# Patient Record
Sex: Female | Born: 1935 | Race: Black or African American | Hispanic: No | Smoking: Former smoker
Health system: Southern US, Community
[De-identification: ages and names within clinical notes are randomized; demographics above are authoritative.]

## PROBLEM LIST (undated history)

## (undated) DIAGNOSIS — E669 Obesity, unspecified: Secondary | ICD-10-CM

## (undated) DIAGNOSIS — E785 Hyperlipidemia, unspecified: Secondary | ICD-10-CM

## (undated) DIAGNOSIS — N189 Chronic kidney disease, unspecified: Secondary | ICD-10-CM

## (undated) DIAGNOSIS — M1990 Unspecified osteoarthritis, unspecified site: Secondary | ICD-10-CM

## (undated) DIAGNOSIS — Z9289 Personal history of other medical treatment: Secondary | ICD-10-CM

## (undated) DIAGNOSIS — I10 Essential (primary) hypertension: Secondary | ICD-10-CM

## (undated) DIAGNOSIS — I2510 Atherosclerotic heart disease of native coronary artery without angina pectoris: Secondary | ICD-10-CM

## (undated) DIAGNOSIS — I219 Acute myocardial infarction, unspecified: Secondary | ICD-10-CM

## (undated) DIAGNOSIS — E538 Deficiency of other specified B group vitamins: Secondary | ICD-10-CM

## (undated) DIAGNOSIS — I509 Heart failure, unspecified: Secondary | ICD-10-CM

## (undated) HISTORY — DX: Unspecified osteoarthritis, unspecified site: M19.90

## (undated) HISTORY — PX: BACK SURGERY: SHX140

## (undated) HISTORY — DX: Hyperlipidemia, unspecified: E78.5

## (undated) HISTORY — DX: Acute myocardial infarction, unspecified: I21.9

## (undated) HISTORY — DX: Personal history of other medical treatment: Z92.89

## (undated) HISTORY — PX: CATARACT EXTRACTION: SUR2

## (undated) HISTORY — DX: Deficiency of other specified B group vitamins: E53.8

## (undated) HISTORY — PX: APPENDECTOMY: SHX54

## (undated) HISTORY — PX: OTHER SURGICAL HISTORY: SHX169

## (undated) HISTORY — DX: Heart failure, unspecified: I50.9

## (undated) HISTORY — DX: Essential (primary) hypertension: I10

## (undated) HISTORY — DX: Chronic kidney disease, unspecified: N18.9

## (undated) HISTORY — DX: Obesity, unspecified: E66.9

---

## 1970-02-23 HISTORY — PX: ABDOMINAL HYSTERECTOMY: SHX81

## 2003-12-03 ENCOUNTER — Ambulatory Visit (HOSPITAL_COMMUNITY): Admission: RE | Admit: 2003-12-03 | Discharge: 2003-12-03 | Payer: Self-pay | Admitting: Internal Medicine

## 2006-07-27 ENCOUNTER — Encounter: Admission: RE | Admit: 2006-07-27 | Discharge: 2006-07-27 | Payer: Self-pay | Admitting: Internal Medicine

## 2009-05-14 ENCOUNTER — Encounter: Admission: RE | Admit: 2009-05-14 | Discharge: 2009-05-14 | Payer: Self-pay | Admitting: Family Medicine

## 2011-01-05 ENCOUNTER — Encounter: Payer: Self-pay | Admitting: Cardiology

## 2011-01-05 ENCOUNTER — Encounter (HOSPITAL_COMMUNITY): Payer: Self-pay | Admitting: Pharmacy Technician

## 2011-01-05 ENCOUNTER — Encounter: Payer: Self-pay | Admitting: *Deleted

## 2011-01-05 ENCOUNTER — Ambulatory Visit (INDEPENDENT_AMBULATORY_CARE_PROVIDER_SITE_OTHER): Payer: Medicare Other | Admitting: Cardiology

## 2011-01-05 DIAGNOSIS — E119 Type 2 diabetes mellitus without complications: Secondary | ICD-10-CM

## 2011-01-05 DIAGNOSIS — E669 Obesity, unspecified: Secondary | ICD-10-CM

## 2011-01-05 DIAGNOSIS — E1129 Type 2 diabetes mellitus with other diabetic kidney complication: Secondary | ICD-10-CM | POA: Insufficient documentation

## 2011-01-05 DIAGNOSIS — I10 Essential (primary) hypertension: Secondary | ICD-10-CM

## 2011-01-05 DIAGNOSIS — R0989 Other specified symptoms and signs involving the circulatory and respiratory systems: Secondary | ICD-10-CM | POA: Insufficient documentation

## 2011-01-05 DIAGNOSIS — Z01810 Encounter for preprocedural cardiovascular examination: Secondary | ICD-10-CM

## 2011-01-05 DIAGNOSIS — R079 Chest pain, unspecified: Secondary | ICD-10-CM | POA: Insufficient documentation

## 2011-01-05 MED ORDER — NITROGLYCERIN 0.4 MG SL SUBL: 0.4000 mg | SUBLINGUAL_TABLET | SUBLINGUAL | Status: DC | PRN

## 2011-01-05 MED ORDER — ASPIRIN 81 MG PO TBEC: 81.0000 mg | DELAYED_RELEASE_TABLET | Freq: Every day | ORAL | Status: AC

## 2011-01-05 MED ORDER — ISOSORBIDE MONONITRATE ER 30 MG PO TB24: 30.0000 mg | ORAL_TABLET | Freq: Every day | ORAL | Status: DC

## 2011-01-05 NOTE — Assessment & Plan Note (Signed)
Her blood pressure is elevated and I will address this further at the time of her catheterization.

## 2011-01-05 NOTE — Assessment & Plan Note (Signed)
Per her primary M.D. Of note she will hold her metformin per protocol.  Of note I will check a lipid profile fasting. She would benefit from a statin.

## 2011-01-05 NOTE — Progress Notes (Signed)
HPI The patient has no prior cardiac history. However, she has significant cardiovascular risk factors. [facility] called today to add her to my schedule as she's been having chest discomfort. She says this started about 2 weeks ago. She describes a squeezing sensation under her sternum and left breast. It is sporadic. It does not last long but it is intense when it occurs. It is not associated with nausea or vomiting though she has gotten diaphoretic. It happens at rest. There is no radiation to her jaw or to her arms. The last episode was yesterday at church. She is not overly active but she has not noticed this happening with her daily activities. She's not describing any new shortness of breath, PND or orthopnea. She has not been noticing any palpitations, presyncope or syncope. She has never had any prior cardiac testing.  Allergies  Allergen Reactions  . Lisinopril   . Penicillins     Current Outpatient Prescriptions  Medication Sig Dispense Refill  . amLODipine (NORVASC) 5 MG tablet Take 5 mg by mouth daily.        . metFORMIN (GLUMETZA) 500 MG (MOD) 24 hr tablet Take 500 mg by mouth 2 (two) times daily with a meal.        . valsartan (DIOVAN) 80 MG tablet Take 80 mg by mouth daily.        Marland Kitchen aspirin 81 MG EC tablet Take 1 tablet (81 mg total) by mouth daily. Swallow whole.  30 tablet  0  . isosorbide mononitrate (IMDUR) 30 MG 24 hr tablet Take 1 tablet (30 mg total) by mouth daily.  30 tablet  11  . nitroGLYCERIN (NITROSTAT) 0.4 MG SL tablet Place 1 tablet (0.4 mg total) under the tongue every 5 (five) minutes as needed for chest pain.  25 tablet  11    Past Medical History  Diagnosis Date  . Hypertension   . Diabetes mellitus   . Obesity     Past Surgical History  Procedure Date  . Appendectomy   . Tumor removed     Abdomen (benign)  . Back surgery     Family History  Problem Relation Age of Onset  . Diabetes Mother     History   Social History  . Marital Status:  Widowed    Spouse Name: N/A    Number of Children: 1  . Years of Education: N/A   Occupational History  . Retired    Social History Main Topics  . Smoking status: Former Smoker    Types: Cigarettes    Quit date: 02/23/1993  . Smokeless tobacco: Not on file  . Alcohol Use: No  . Drug Use: No  . Sexually Active: Not on file   Other Topics Concern  . Not on file   Social History Narrative   Lives alone.  2 grandchildren.  1 great grand.    ROS: As stated in the HPI and negative for all other systems.  PHYSICAL EXAM BP 160/64  Pulse 70  Ht 5\' 2"  (1.575 m)  Wt 199 lb (90.266 kg)  BMI 36.40 kg/m2 GENERAL:  Well appearing HEENT:  Pupils equal round and reactive, fundi not visualized, oral mucosa unremarkable NECK:  No jugular venous distention, waveform within normal limits, carotid upstroke brisk and symmetric, rightcarotid bruit, no thyromegaly LYMPHATICS:  No cervical, inguinal adenopathy LUNGS:  Clear to auscultation bilaterally BACK:  No CVA tenderness CHEST:  Unremarkable HEART:  PMI not displaced or sustained,S1 and S2 within normal limits, no  S3, no S4, no clicks, no rubs, no murmurs ABD:  Flat, positive bowel sounds normal in frequency in pitch, no bruits, no rebound, no guarding, no midline pulsatile mass, no hepatomegaly, no splenomegaly EXT:  2 plus pulses throughout, no edema, no cyanosis no clubbing, left femoral bruit. SKIN:  No rashes no nodules NEURO:  Cranial nerves II through XII grossly intact, motor grossly intact throughout PSYCH:  Cognitively intact, oriented to person place and time   EKG:  Sinus rhythm, rate 66, axis within normal limits, intervals within normal limits, no acute ST-T wave changes. 01/05/2011   ASSESSMENT AND PLAN

## 2011-01-05 NOTE — Assessment & Plan Note (Signed)
She will need carotid doppler.

## 2011-01-05 NOTE — Assessment & Plan Note (Signed)
I am concerned that her chest discomfort is unstable angina. She has significant cardiovascular risk factors and evidence of vascular disease on her exam. I think the pretest probability of obstructive coronary disease is very high. Therefore, cardiac catheterization is indicated. I discussed the risks benefits of this with her at length. She agrees to proceed. I will start her on a low dose of Imdur. She is taking an aspirin already area she will continue her other medications holding her metformin. She will be given supplementary glycerin as well.

## 2011-01-05 NOTE — Assessment & Plan Note (Signed)
The patient understands the need to lose weight with diet and exercise. We have discussed specific strategies for this.  

## 2011-01-05 NOTE — Patient Instructions (Signed)
Please start Isosorbide 30 mg a day Continue all other medications as listed You may take SL NTg for chest pain if needed. To use please sit or lay down first, place 1 tablet under your tongue wait 5 minutes if the chest pain continues you may use a 2nd one - wait five minutes.  If is still remain you may use a 3rd one however you then need to call 911 for transport to the hospital.  Do Not Drive Yourself.  This medication may cause headache or a slight tingling or burning sensation under your tongue.  It is a normal occurrence.  Please have blood work today.  Your physician has requested that you have a cardiac catheterization. Cardiac catheterization is used to diagnose and/or treat various heart conditions. Doctors may recommend this procedure for a number of different reasons. The most common reason is to evaluate chest pain. Chest pain can be a symptom of coronary artery disease (CAD), and cardiac catheterization can show whether plaque is narrowing or blocking your heart's arteries. This procedure is also used to evaluate the valves, as well as measure the blood flow and oxygen levels in different parts of your heart. For further information please visit [email or web address] Please follow instruction sheet, as given.

## 2011-01-06 ENCOUNTER — Encounter (HOSPITAL_COMMUNITY): Payer: Self-pay | Admitting: Cardiology

## 2011-01-06 ENCOUNTER — Encounter (HOSPITAL_COMMUNITY)
Admission: RE | Disposition: A | Discharge status: Home / Self Care | Payer: Self-pay | Source: Ambulatory Visit | Attending: Cardiology

## 2011-01-06 ENCOUNTER — Ambulatory Visit (HOSPITAL_COMMUNITY)
Admission: RE | Admit: 2011-01-06 | Discharge: 2011-01-06 | Disposition: A | Discharge status: Home / Self Care | Payer: Medicare Other | Source: Ambulatory Visit | Attending: Cardiology | Admitting: Cardiology

## 2011-01-06 ENCOUNTER — Encounter (HOSPITAL_COMMUNITY): Payer: Self-pay

## 2011-01-06 DIAGNOSIS — I2510 Atherosclerotic heart disease of native coronary artery without angina pectoris: Secondary | ICD-10-CM | POA: Insufficient documentation

## 2011-01-06 DIAGNOSIS — Z01810 Encounter for preprocedural cardiovascular examination: Secondary | ICD-10-CM

## 2011-01-06 HISTORY — PX: LEFT HEART CATHETERIZATION WITH CORONARY ANGIOGRAM: SHX5451

## 2011-01-06 LAB — CBC WITH DIFFERENTIAL/PLATELET
Basophils Relative: 0.2 % (ref 0.0–3.0)
Eosinophils Relative: 1.4 % (ref 0.0–5.0)
HCT: 37.3 % (ref 36.0–46.0)
MCV: 89.8 fl (ref 78.0–100.0)
Neutro Abs: 5.6 10*3/uL (ref 1.4–7.7)
Neutrophils Relative %: 51.6 % (ref 43.0–77.0)
Platelets: 272 10*3/uL (ref 150.0–400.0)
RBC: 4.15 Mil/uL (ref 3.87–5.11)
WBC: 10.8 10*3/uL — ABNORMAL HIGH (ref 4.5–10.5)

## 2011-01-06 LAB — BASIC METABOLIC PANEL
BUN: 26 mg/dL — ABNORMAL HIGH (ref 6–23)
GFR: 49.69 mL/min — ABNORMAL LOW (ref >60.00)
Potassium: 5 mEq/L (ref 3.5–5.1)
Sodium: 140 mEq/L (ref 135–145)

## 2011-01-06 LAB — GLUCOSE, CAPILLARY: Glucose-Capillary: 150 mg/dL — ABNORMAL HIGH (ref 70–99)

## 2011-01-06 LAB — APTT: aPTT: 31.2 s — ABNORMAL HIGH (ref 21.7–28.8)

## 2011-01-06 SURGERY — LEFT HEART CATHETERIZATION WITH CORONARY ANGIOGRAM
Anesthesia: LOCAL

## 2011-01-06 MED ORDER — OXYCODONE-ACETAMINOPHEN 5-325 MG PO TABS: 1.0000 | ORAL_TABLET | ORAL | Status: DC | PRN

## 2011-01-06 MED ORDER — ASPIRIN 81 MG PO CHEW
324.0000 mg | CHEWABLE_TABLET | ORAL | Status: AC
  Administered 2011-01-06: 324 mg via ORAL
  Filled 2011-01-06: qty 4

## 2011-01-06 MED ORDER — LIDOCAINE HCL (PF) 1 % IJ SOLN
INTRAMUSCULAR | Status: AC
  Filled 2011-01-06: qty 30

## 2011-01-06 MED ORDER — HEPARIN (PORCINE) IN NACL 2-0.9 UNIT/ML-% IJ SOLN
INTRAMUSCULAR | Status: AC
  Filled 2011-01-06: qty 2000

## 2011-01-06 MED ORDER — ATROPINE SULFATE 1 MG/ML IJ SOLN
INTRAMUSCULAR | Status: AC
  Filled 2011-01-06: qty 1

## 2011-01-06 MED ORDER — METFORMIN HCL 500 MG PO TABS: 500.0000 mg | ORAL_TABLET | Freq: Two times a day (BID) | ORAL | Status: DC

## 2011-01-06 MED ORDER — VERAPAMIL HCL 2.5 MG/ML IV SOLN
INTRAVENOUS | Status: AC
  Filled 2011-01-06: qty 2

## 2011-01-06 MED ORDER — ONDANSETRON HCL 4 MG/2ML IJ SOLN: 4.0000 mg | Freq: Four times a day (QID) | INTRAMUSCULAR | Status: DC | PRN

## 2011-01-06 MED ORDER — FENTANYL CITRATE 0.05 MG/ML IJ SOLN
INTRAMUSCULAR | Status: AC
  Filled 2011-01-06: qty 2

## 2011-01-06 MED ORDER — NITROGLYCERIN 0.2 MG/ML ON CALL CATH LAB
INTRAVENOUS | Status: AC
  Filled 2011-01-06: qty 1

## 2011-01-06 MED ORDER — ACETAMINOPHEN 325 MG PO TABS: 650.0000 mg | ORAL_TABLET | ORAL | Status: DC | PRN

## 2011-01-06 MED ORDER — MIDAZOLAM HCL 2 MG/2ML IJ SOLN
INTRAMUSCULAR | Status: AC
  Filled 2011-01-06: qty 2

## 2011-01-06 MED ORDER — SODIUM CHLORIDE 0.9 % IV SOLN
250.0000 mL | INTRAVENOUS | Status: DC
  Administered 2011-01-06: 1 mL/kg/h via INTRAVENOUS

## 2011-01-06 MED ORDER — SODIUM CHLORIDE 0.9 % IJ SOLN: 3.0000 mL | Freq: Two times a day (BID) | INTRAMUSCULAR | Status: DC

## 2011-01-06 MED ORDER — HEPARIN SODIUM (PORCINE) 1000 UNIT/ML IJ SOLN
INTRAMUSCULAR | Status: AC
  Filled 2011-01-06: qty 1

## 2011-01-06 MED ORDER — ONDANSETRON HCL 4 MG/2ML IJ SOLN
INTRAMUSCULAR | Status: AC
  Filled 2011-01-06: qty 2

## 2011-01-06 MED ORDER — SODIUM CHLORIDE 0.9 % IV SOLN: 1.0000 mL/kg/h | INTRAVENOUS | Status: DC

## 2011-01-06 NOTE — H&P (View-Only) (Signed)
HPI The patient has no prior cardiac history. However, she has significant cardiovascular risk factors. [facility] called today to add her to my schedule as she's been having chest discomfort. She says this started about 2 weeks ago. She describes a squeezing sensation under her sternum and left breast. It is sporadic. It does not last long but it is intense when it occurs. It is not associated with nausea or vomiting though she has gotten diaphoretic. It happens at rest. There is no radiation to her jaw or to her arms. The last episode was yesterday at church. She is not overly active but she has not noticed this happening with her daily activities. She's not describing any new shortness of breath, PND or orthopnea. She has not been noticing any palpitations, presyncope or syncope. She has never had any prior cardiac testing.  Allergies  Allergen Reactions  . Lisinopril   . Penicillins     Current Outpatient Prescriptions  Medication Sig Dispense Refill  . amLODipine (NORVASC) 5 MG tablet Take 5 mg by mouth daily.        . metFORMIN (GLUMETZA) 500 MG (MOD) 24 hr tablet Take 500 mg by mouth 2 (two) times daily with a meal.        . valsartan (DIOVAN) 80 MG tablet Take 80 mg by mouth daily.        . aspirin 81 MG EC tablet Take 1 tablet (81 mg total) by mouth daily. Swallow whole.  30 tablet  0  . isosorbide mononitrate (IMDUR) 30 MG 24 hr tablet Take 1 tablet (30 mg total) by mouth daily.  30 tablet  11  . nitroGLYCERIN (NITROSTAT) 0.4 MG SL tablet Place 1 tablet (0.4 mg total) under the tongue every 5 (five) minutes as needed for chest pain.  25 tablet  11    Past Medical History  Diagnosis Date  . Hypertension   . Diabetes mellitus   . Obesity     Past Surgical History  Procedure Date  . Appendectomy   . Tumor removed     Abdomen (benign)  . Back surgery     Family History  Problem Relation Age of Onset  . Diabetes Mother     History   Social History  . Marital Status:  Widowed    Spouse Name: N/A    Number of Children: 1  . Years of Education: N/A   Occupational History  . Retired    Social History Main Topics  . Smoking status: Former Smoker    Types: Cigarettes    Quit date: 02/23/1993  . Smokeless tobacco: Not on file  . Alcohol Use: No  . Drug Use: No  . Sexually Active: Not on file   Other Topics Concern  . Not on file   Social History Narrative   Lives alone.  2 grandchildren.  1 great grand.    ROS: As stated in the HPI and negative for all other systems.  PHYSICAL EXAM BP 160/64  Pulse 70  Ht 5' 2" (1.575 m)  Wt 199 lb (90.266 kg)  BMI 36.40 kg/m2 GENERAL:  Well appearing HEENT:  Pupils equal round and reactive, fundi not visualized, oral mucosa unremarkable NECK:  No jugular venous distention, waveform within normal limits, carotid upstroke brisk and symmetric, rightcarotid bruit, no thyromegaly LYMPHATICS:  No cervical, inguinal adenopathy LUNGS:  Clear to auscultation bilaterally BACK:  No CVA tenderness CHEST:  Unremarkable HEART:  PMI not displaced or sustained,S1 and S2 within normal limits, no   S3, no S4, no clicks, no rubs, no murmurs ABD:  Flat, positive bowel sounds normal in frequency in pitch, no bruits, no rebound, no guarding, no midline pulsatile mass, no hepatomegaly, no splenomegaly EXT:  2 plus pulses throughout, no edema, no cyanosis no clubbing, left femoral bruit. SKIN:  No rashes no nodules NEURO:  Cranial nerves II through XII grossly intact, motor grossly intact throughout PSYCH:  Cognitively intact, oriented to person place and time   EKG:  Sinus rhythm, rate 66, axis within normal limits, intervals within normal limits, no acute ST-T wave changes. 01/05/2011   ASSESSMENT AND PLAN    

## 2011-01-06 NOTE — Interval H&P Note (Signed)
History and Physical Interval Note:   01/06/2011   1:53 PM   Alexandra Gross  has presented today for surgery, with the diagnosis of Chest pain  The various methods of treatment have been discussed with the patient and family. After consideration of risks, benefits and other options for treatment, the patient has consented to  Procedure(s): LEFT HEART CATHETERIZATION WITH CORONARY ANGIOGRAM as a surgical intervention .  The patients' history has been reviewed, patient examined, no change in status, stable for surgery.  I have reviewed the patients' chart and labs.  Questions were answered to the patient's satisfaction.     Rollene Rotunda  MD

## 2011-01-06 NOTE — Op Note (Signed)
Cardiac Catheterization Procedure Note  Name: Alexandra Gross MRN: [id number] DOB: 07/05/1928  Procedure: Left Heart Cath, Selective Coronary Angiography, LV angiography  Indication: Chest Pain   Procedural details: The right radial was prepped, draped, and anesthetized with 1% lidocaine. Using modified Seldinger technique, a 5 French sheath was introduced into the right radial artery. Standard Judkins catheters were used for coronary angiography and left ventriculography. Catheter exchanges were performed over a guidewire. There were no immediate procedural complications. The patient was transferred to the post catheterization recovery area for further monitoring.  Procedural Findings:  Hemodynamics:      AO 135/50    LV 132/18    Coronary angiography:  Coronary dominance: Right  Left mainstem:   Normal  Left anterior descending (LAD):   Tortuous.  Large vessel wraps the apex.  Mild luminal irregularities.  D1 small and normal.  D2 moderate and normal  Left circumflex (LCx):  AV groove ostial 25%.  Diffuse luminal irregularities.  RI moderate sized and branching.  Diffuse luminal irregularities.  Long 50% stenosis before a small PL.  Right coronary artery (RCA):  Moderate calcification.  Prox 50% focal lesion.  Long distal 50% tandem lesions before the PDA.  PDA moderate with luminal irregularities.  PL moderate and normal.  Left ventriculography: Left ventricular systolic function is normal, LVEF is estimated at 55-65%, there is no significant mitral regurgitation   Final Conclusions:   Nonobstructive CAD with preserved EF  Recommendations: Medical management with risk reduction.     Rollene Rotunda 01/06/2011, 2:35 PM

## 2011-02-10 ENCOUNTER — Encounter: Payer: Self-pay | Admitting: Internal Medicine

## 2011-02-10 DIAGNOSIS — Z0000 Encounter for general adult medical examination without abnormal findings: Secondary | ICD-10-CM | POA: Insufficient documentation

## 2011-02-16 ENCOUNTER — Telehealth: Payer: Self-pay

## 2011-02-16 ENCOUNTER — Other Ambulatory Visit (INDEPENDENT_AMBULATORY_CARE_PROVIDER_SITE_OTHER): Payer: Medicare Other

## 2011-02-16 ENCOUNTER — Encounter: Payer: Self-pay | Admitting: Internal Medicine

## 2011-02-16 ENCOUNTER — Ambulatory Visit (INDEPENDENT_AMBULATORY_CARE_PROVIDER_SITE_OTHER): Payer: Medicare Other | Admitting: Internal Medicine

## 2011-02-16 VITALS — BP 144/60 | HR 73 | Temp 99.2°F | Ht 62.0 in | Wt 199.2 lb

## 2011-02-16 DIAGNOSIS — E119 Type 2 diabetes mellitus without complications: Secondary | ICD-10-CM

## 2011-02-16 DIAGNOSIS — M25569 Pain in unspecified knee: Secondary | ICD-10-CM

## 2011-02-16 DIAGNOSIS — M25561 Pain in right knee: Secondary | ICD-10-CM | POA: Insufficient documentation

## 2011-02-16 DIAGNOSIS — Z0000 Encounter for general adult medical examination without abnormal findings: Secondary | ICD-10-CM

## 2011-02-16 DIAGNOSIS — I2510 Atherosclerotic heart disease of native coronary artery without angina pectoris: Secondary | ICD-10-CM | POA: Insufficient documentation

## 2011-02-16 DIAGNOSIS — I10 Essential (primary) hypertension: Secondary | ICD-10-CM

## 2011-02-16 DIAGNOSIS — E538 Deficiency of other specified B group vitamins: Secondary | ICD-10-CM

## 2011-02-16 DIAGNOSIS — M25562 Pain in left knee: Secondary | ICD-10-CM

## 2011-02-16 LAB — URINALYSIS, ROUTINE W REFLEX MICROSCOPIC
Bilirubin Urine: NEGATIVE
Leukocytes, UA: NEGATIVE
Nitrite: NEGATIVE
Specific Gravity, Urine: 1.02 (ref 1.000–1.030)
Total Protein, Urine: NEGATIVE
pH: 6 (ref 5.0–8.0)

## 2011-02-16 LAB — IBC PANEL
Saturation Ratios: 24.1 % (ref 20.0–50.0)
Transferrin: 275.4 mg/dL (ref 212.0–360.0)

## 2011-02-16 LAB — LIPID PANEL
LDL Cholesterol: 131 mg/dL — ABNORMAL HIGH (ref 0–99)
Total CHOL/HDL Ratio: 4
VLDL: 20.2 mg/dL (ref 0.0–40.0)

## 2011-02-16 LAB — TSH: TSH: 1.83 u[IU]/mL (ref 0.35–5.50)

## 2011-02-16 LAB — BASIC METABOLIC PANEL
GFR: 48.04 mL/min — ABNORMAL LOW (ref >60.00)
Potassium: 5 mEq/L (ref 3.5–5.1)
Sodium: 140 mEq/L (ref 135–145)

## 2011-02-16 LAB — HEPATIC FUNCTION PANEL
AST: 19 U/L (ref 0–37)
Alkaline Phosphatase: 60 U/L (ref 39–117)
Bilirubin, Direct: 0.1 mg/dL (ref 0.0–0.3)
Total Bilirubin: 0.7 mg/dL (ref 0.3–1.2)

## 2011-02-16 LAB — MICROALBUMIN / CREATININE URINE RATIO: Microalb, Ur: 1.7 mg/dL (ref 0.0–1.9)

## 2011-02-16 LAB — CBC WITH DIFFERENTIAL/PLATELET
Basophils Relative: 0.5 % (ref 0.0–3.0)
Eosinophils Relative: 2.2 % (ref 0.0–5.0)
Lymphocytes Relative: 41.1 % (ref 12.0–46.0)
Monocytes Absolute: 0.6 10*3/uL (ref 0.1–1.0)
Monocytes Relative: 7 % (ref 3.0–12.0)
Neutrophils Relative %: 49.2 % (ref 43.0–77.0)
Platelets: 273 10*3/uL (ref 150.0–400.0)
RBC: 3.97 Mil/uL (ref 3.87–5.11)
WBC: 9 10*3/uL (ref 4.5–10.5)

## 2011-02-16 MED ORDER — ATORVASTATIN CALCIUM 10 MG PO TABS: 10.0000 mg | ORAL_TABLET | Freq: Every day | ORAL | Status: DC

## 2011-02-16 MED ORDER — METFORMIN HCL ER (MOD) 500 MG PO TB24: 500.0000 mg | ORAL_TABLET | Freq: Two times a day (BID) | ORAL | Status: DC

## 2011-02-16 NOTE — Assessment & Plan Note (Signed)
C/w bilat knee djd, for handicap form filled out today

## 2011-02-16 NOTE — Assessment & Plan Note (Signed)
Overall doing well, age appropriate education and counseling updated, referrals for preventative services and immunizations addressed, dietary and smoking counseling addressed, most recent labs and ECG reviewed.  I have personally reviewed and have noted: 1) the patient's medical and social history 2) The pt's use of alcohol, tobacco, and illicit drugs 3) The patient's current medications and supplements 4) Functional ability including ADL's, fall risk, home safety risk, hearing and visual impairment 5) Diet and physical activities 6) Evidence for depression or mood disorder 7) The patient's height, weight, and BMI have been recorded in the chart I have made referrals, and provided counseling and education based on review of the above Decliens immunizations  ro colonoscopy

## 2011-02-16 NOTE — Telephone Encounter (Signed)
Ok for change.

## 2011-02-16 NOTE — Telephone Encounter (Signed)
[facility] does not stock Glumetza, may they use generic Glucophage XR instead?

## 2011-02-16 NOTE — Telephone Encounter (Signed)
Pharmacy informed.

## 2011-02-16 NOTE — Patient Instructions (Signed)
Continue all other medications as before Please go to LAB in the Basement for the blood and/or urine tests to be done today Please call the phone number [phone] (the [employer] System) for results of testing in 2-3 days;  When calling, simply dial the number, and when prompted enter the MRN number above (the Medical Record Number) and the # key, then the message should start. Please return in 6 mo with Lab testing done 3-5 days before  

## 2011-02-17 ENCOUNTER — Encounter: Payer: Self-pay | Admitting: Internal Medicine

## 2011-02-17 NOTE — Assessment & Plan Note (Signed)
Mild elev today, controlled per pt at home, to cont to monitor at home and next visit  BP Readings from Last 3 Encounters:  02/16/11 144/60  01/06/11 133/48  01/06/11 133/48

## 2011-02-17 NOTE — Assessment & Plan Note (Signed)
stable overall by hx and exam, most recent data reviewed with pt, and pt to continue medical treatment as before Lab Results  Component Value Date   HGBA1C 6.9* 02/16/2011

## 2011-02-17 NOTE — Progress Notes (Signed)
Subjective:    Patient ID: Alexandra Gross, female    DOB: 07/05/1928, 77 y.o.   MRN: [id number]  HPI  Here for wellness and f/u;  Overall doing ok;  Pt denies CP, worsening SOB, DOE, wheezing, orthopnea, PND, worsening LE edema, palpitations, dizziness or syncope.  Pt denies neurological change such as new Headache, facial or extremity weakness.  Pt denies polydipsia, polyuria, or low sugar symptoms. Pt states overall good compliance with treatment and medications, good tolerability, and trying to follow lower cholesterol diet.  Pt denies worsening depressive symptoms, suicidal ideation or panic. No fever, wt loss, night sweats, loss of appetite, or other constitutional symptoms.  Pt states good ability with ADL's, low fall risk, home safety reviewed and adequate, no significant changes in hearing or vision, and occasionally active with exercise.  Incidentally, pt son died in MVA 1 mo ago, with whom she was very close, and he called her every day. Denies need for further eval or tx, including counseling or SSRI trial.  Has strong faith and social support.  Does have signficant bilat knee pain with ambulation, does not want meds, but asks for handicap permit to park in front of stores.  BP at  Home has been < 140/90 Past Medical History  Diagnosis Date  . Hypertension   . Diabetes mellitus   . Obesity   . Arthritis    Past Surgical History  Procedure Date  . Appendectomy   . Tumor removed     Abdomen (benign)  . Back surgery   . Abdominal hysterectomy 1972    reports that she quit smoking about 17 years ago. Her smoking use included Cigarettes. She does not have any smokeless tobacco history on file. She reports that she does not drink alcohol or use illicit drugs. family history includes Arthritis in her mother; Diabetes in her mother; and Hypertension in her mother. Allergies  Allergen Reactions  . Lisinopril Other (See Comments)    unknown  . Penicillins Other (See Comments)    unknown    Current Outpatient Prescriptions on File Prior to Visit  Medication Sig Dispense Refill  . amLODipine (NORVASC) 5 MG tablet Take 5 mg by mouth daily.       Marland Kitchen aspirin 81 MG EC tablet Take 1 tablet (81 mg total) by mouth daily. Swallow whole.  30 tablet  0  . COD LIVER OIL PO Take 1 capsule by mouth daily.        . nitroGLYCERIN (NITROSTAT) 0.4 MG SL tablet Place 1 tablet (0.4 mg total) under the tongue every 5 (five) minutes as needed for chest pain.  25 tablet  11  . valsartan (DIOVAN) 80 MG tablet Take 80 mg by mouth daily.       Marland Kitchen VITAMIN E PO Take 1 capsule by mouth daily.         Review of Systems Review of Systems  Constitutional: Negative for diaphoresis, activity change, appetite change and unexpected weight change.  HENT: Negative for hearing loss, ear pain, facial swelling, mouth sores and neck stiffness.   Eyes: Negative for pain, redness and visual disturbance.  Respiratory: Negative for shortness of breath and wheezing.   Cardiovascular: Negative for chest pain and palpitations.  Gastrointestinal: Negative for diarrhea, blood in stool, abdominal distention and rectal pain.  Genitourinary: Negative for hematuria, flank pain and decreased urine volume.  Musculoskeletal: Negative for myalgias and joint swelling.  Skin: Negative for color change and wound.  Neurological: Negative for syncope and numbness.  Hematological: Negative for adenopathy.  Psychiatric/Behavioral: Negative for hallucinations, self-injury, decreased concentration and agitation.      Objective:   Physical Exam BP 144/60  Pulse 73  Temp(Src) 99.2 F (37.3 C) (Oral)  Ht 5\' 2"  (1.575 m)  Wt 199 lb 4 oz (90.379 kg)  BMI 36.44 kg/m2  SpO2 99% Physical Exam  VS noted Constitutional: Pt is oriented to person, place, and time. Appears well-developed and well-nourished.  HENT:  Head: Normocephalic and atraumatic.  Right Ear: External ear normal.  Left Ear: External ear normal.  Nose: Nose normal.   Mouth/Throat: Oropharynx is clear and moist.  Eyes: Conjunctivae and EOM are normal. Pupils are equal, round, and reactive to light.  Neck: Normal range of motion. Neck supple. No JVD present. No tracheal deviation present.  Cardiovascular: Normal rate, regular rhythm, normal heart sounds and intact distal pulses.   Pulmonary/Chest: Effort normal and breath sounds normal.  Abdominal: Soft. Bowel sounds are normal. There is no tenderness.  Musculoskeletal: Normal range of motion. Exhibits no edema.  Lymphadenopathy:  Has no cervical adenopathy.  Neurological: Pt is alert and oriented to person, place, and time. Pt has normal reflexes. No cranial nerve deficit.  Skin: Skin is warm and dry. No rash noted.  Psychiatric:  Has  normal mood and affect. Behavior is normal. Saddened but not tearful or depressed affect today Bilat knee crepitus and bony DJD changes, no effusion, has FROM    Assessment & Plan:

## 2011-02-18 ENCOUNTER — Other Ambulatory Visit: Payer: Self-pay | Admitting: Internal Medicine

## 2011-02-18 DIAGNOSIS — E538 Deficiency of other specified B group vitamins: Secondary | ICD-10-CM

## 2011-02-18 LAB — B12 AND FOLATE PANEL: Folate: 11.4 ng/mL (ref >5.9)

## 2011-04-06 ENCOUNTER — Other Ambulatory Visit: Payer: Self-pay

## 2011-04-06 MED ORDER — VALSARTAN 80 MG PO TABS: 80.0000 mg | ORAL_TABLET | Freq: Every day | ORAL | Status: DC

## 2011-04-06 MED ORDER — AMLODIPINE BESYLATE 5 MG PO TABS: 5.0000 mg | ORAL_TABLET | Freq: Every day | ORAL | Status: DC

## 2011-08-18 ENCOUNTER — Encounter: Payer: Self-pay | Admitting: Internal Medicine

## 2011-08-18 ENCOUNTER — Other Ambulatory Visit (INDEPENDENT_AMBULATORY_CARE_PROVIDER_SITE_OTHER): Payer: Medicare Other

## 2011-08-18 ENCOUNTER — Other Ambulatory Visit: Payer: Self-pay | Admitting: Internal Medicine

## 2011-08-18 ENCOUNTER — Ambulatory Visit (INDEPENDENT_AMBULATORY_CARE_PROVIDER_SITE_OTHER): Payer: Medicare Other | Admitting: Internal Medicine

## 2011-08-18 VITALS — BP 122/62 | HR 70 | Temp 98.1°F | Ht 62.5 in | Wt 204.2 lb

## 2011-08-18 DIAGNOSIS — I10 Essential (primary) hypertension: Secondary | ICD-10-CM

## 2011-08-18 DIAGNOSIS — E538 Deficiency of other specified B group vitamins: Secondary | ICD-10-CM

## 2011-08-18 DIAGNOSIS — N189 Chronic kidney disease, unspecified: Secondary | ICD-10-CM | POA: Insufficient documentation

## 2011-08-18 DIAGNOSIS — E119 Type 2 diabetes mellitus without complications: Secondary | ICD-10-CM

## 2011-08-18 DIAGNOSIS — Z0000 Encounter for general adult medical examination without abnormal findings: Secondary | ICD-10-CM

## 2011-08-18 DIAGNOSIS — E785 Hyperlipidemia, unspecified: Secondary | ICD-10-CM

## 2011-08-18 LAB — BASIC METABOLIC PANEL
BUN: 20 mg/dL (ref 6–23)
Chloride: 107 mEq/L (ref 96–112)
Creatinine, Ser: 1.5 mg/dL — ABNORMAL HIGH (ref 0.4–1.2)
Glucose, Bld: 113 mg/dL — ABNORMAL HIGH (ref 70–99)

## 2011-08-18 LAB — LIPID PANEL
Cholesterol: 185 mg/dL (ref 0–200)
LDL Cholesterol: 115 mg/dL — ABNORMAL HIGH (ref 0–99)
Triglycerides: 112 mg/dL (ref 0.0–149.0)

## 2011-08-18 LAB — VITAMIN B12: Vitamin B-12: 292 pg/mL (ref 211–911)

## 2011-08-18 LAB — HEMOGLOBIN A1C: Hgb A1c MFr Bld: 7 % — ABNORMAL HIGH (ref 4.6–6.5)

## 2011-08-18 MED ORDER — ATORVASTATIN CALCIUM 20 MG PO TABS: 20.0000 mg | ORAL_TABLET | Freq: Every day | ORAL | Status: DC

## 2011-08-18 NOTE — Patient Instructions (Addendum)
Continue all other medications as before Please have the pharmacy call with any refills you may need. Please continue your efforts at being more active, low cholesterol diet, and weight control. Please go to LAB in the Basement for the blood and/or urine tests to be done today You will be contacted by phone if any changes need to be made immediately.  Otherwise, you will receive a letter about your results with an explanation. Please return in 6 mo with Lab testing done 3-5 days before

## 2011-08-18 NOTE — Progress Notes (Signed)
Subjective:    Patient ID: Alexandra Gross, female    DOB: 07/05/1928, 78 y.o.   MRN: [id number]  HPI  Here to f/u; overall doing ok,  Pt denies chest pain, increased sob or doe, wheezing, orthopnea, PND, increased LE swelling, palpitations, dizziness or syncope.  Pt denies new neurological symptoms such as new headache, or facial or extremity weakness or numbness   Pt denies polydipsia, polyuria, or low sugar symptoms such as weakness or confusion improved with po intake.  Pt states overall good compliance with meds, trying to follow lower cholesterol, diabetic diet, wt overall stable but little exercise however.  Son lived in [place] in [place] late last yr.  Denies worsening depressive symptoms, suicidal ideation, or panic. Has been taking the oral B12 since last visit.  Has some chronic bilat knee pain with ambulation, no swelling or giveaway or falls. Past Medical History  Diagnosis Date  . Hypertension   . Diabetes mellitus   . Obesity   . Arthritis    Past Surgical History  Procedure Date  . Appendectomy   . Tumor removed     Abdomen (benign)  . Back surgery   . Abdominal hysterectomy 1972    reports that she quit smoking about 18 years ago. Her smoking use included Cigarettes. She does not have any smokeless tobacco history on file. She reports that she does not drink alcohol or use illicit drugs. family history includes Arthritis in her mother; Diabetes in her mother; and Hypertension in her mother. Allergies  Allergen Reactions  . Lisinopril Other (See Comments)    unknown  . Penicillins Other (See Comments)    unknown   Current Outpatient Prescriptions on File Prior to Visit  Medication Sig Dispense Refill  . amLODipine (NORVASC) 5 MG tablet Take 1 tablet (5 mg total) by mouth daily.  30 tablet  11  . aspirin 81 MG EC tablet Take 1 tablet (81 mg total) by mouth daily. Swallow whole.  30 tablet  0  . COD LIVER OIL PO Take 1 capsule by mouth daily.        . metFORMIN (GLUMETZA) 500 MG  (MOD) 24 hr tablet Take 1 tablet (500 mg total) by mouth 2 (two) times daily with a meal.  60 tablet  11  . nitroGLYCERIN (NITROSTAT) 0.4 MG SL tablet Place 1 tablet (0.4 mg total) under the tongue every 5 (five) minutes as needed for chest pain.  25 tablet  11  . valsartan (DIOVAN) 80 MG tablet Take 1 tablet (80 mg total) by mouth daily.  30 tablet  11  . VITAMIN E PO Take 1 capsule by mouth daily.        Marland Kitchen atorvastatin (LIPITOR) 10 MG tablet Take 1 tablet (10 mg total) by mouth daily.  90 tablet  3   Review of Systems Review of Systems  Constitutional: Negative for diaphoresis and unexpected weight change.  HENT: Negative for drooling and tinnitus.   Eyes: Negative for photophobia and visual disturbance.  Respiratory: Negative for choking and stridor.   Gastrointestinal: Negative for vomiting and blood in stool.  Skin: Negative for color change and wound.  Neurological: Negative for tremors and numbness.  Psychiatric/Behavioral: Negative for decreased concentration. The patient is not hyperactive.       Objective:   Physical Exam BP 122/62  Pulse 70  Temp 98.1 F (36.7 C) (Oral)  Ht 5' 2.5" (1.588 m)  Wt 204 lb 4 oz (92.647 kg)  BMI 36.76 kg/m2  SpO2 97% Physical Exam  VS noted Constitutional: Pt appears well-developed and well-nourished.  HENT: Head: Normocephalic.  Right Ear: External ear normal.  Left Ear: External ear normal.  Eyes: Conjunctivae and EOM are normal. Pupils are equal, round, and reactive to light.  Neck: Normal range of motion. Neck supple.  Cardiovascular: Normal rate and regular rhythm.   Pulmonary/Chest: Effort normal and breath sounds normal.  Neurological: Pt is alert. Not confused Skin: Skin is warm. No erythema.  Psychiatric: Pt behavior is normal. Thought content normal. Has some benign forgetflness at home    Assessment & Plan:

## 2011-08-18 NOTE — Assessment & Plan Note (Signed)
stable overall by hx and exam, most recent data reviewed with pt, and pt to continue medical treatment as before Lab Results  Component Value Date   HGBA1C 6.9* 02/16/2011   To further work on diet, wt loss

## 2011-08-18 NOTE — Assessment & Plan Note (Signed)
stable overall by hx and exam, most recent data reviewed with pt, and pt to continue medical treatment as before BP Readings from Last 3 Encounters:  08/18/11 122/62  02/16/11 144/60  01/06/11 133/48

## 2011-08-18 NOTE — Assessment & Plan Note (Signed)
stable overall by hx and exam, most recent data reviewed with pt, and pt to continue medical treatment as before Lab Results  Component Value Date   LDLCALC 131* 02/16/2011

## 2011-08-18 NOTE — Assessment & Plan Note (Signed)
stable overall by hx and exam, most recent data reviewed with pt, and pt to continue medical treatment as before, for f/u today, volume ok Lab Results  Component Value Date   CREATININE 1.4* 02/16/2011

## 2011-08-19 ENCOUNTER — Ambulatory Visit: Payer: Medicare Other | Admitting: Internal Medicine

## 2012-02-08 ENCOUNTER — Other Ambulatory Visit (INDEPENDENT_AMBULATORY_CARE_PROVIDER_SITE_OTHER): Payer: Medicare Other

## 2012-02-08 DIAGNOSIS — IMO0001 Reserved for inherently not codable concepts without codable children: Secondary | ICD-10-CM

## 2012-02-08 DIAGNOSIS — Z0000 Encounter for general adult medical examination without abnormal findings: Secondary | ICD-10-CM

## 2012-02-08 LAB — HEPATIC FUNCTION PANEL
AST: 26 U/L (ref 0–37)
Alkaline Phosphatase: 70 U/L (ref 39–117)
Total Bilirubin: 1 mg/dL (ref 0.3–1.2)

## 2012-02-08 LAB — URINALYSIS, ROUTINE W REFLEX MICROSCOPIC
Specific Gravity, Urine: 1.025 (ref 1.000–1.030)
Urine Glucose: NEGATIVE

## 2012-02-08 LAB — CBC WITH DIFFERENTIAL/PLATELET
Basophils Absolute: 0.2 10*3/uL — ABNORMAL HIGH (ref 0.0–0.1)
Eosinophils Absolute: 0.1 10*3/uL (ref 0.0–0.7)
Lymphocytes Relative: 39.4 % (ref 12.0–46.0)
MCHC: 33.3 g/dL (ref 30.0–36.0)
Monocytes Relative: 6.7 % (ref 3.0–12.0)
Platelets: 302 10*3/uL (ref 150.0–400.0)
RDW: 13.2 % (ref 11.5–14.6)

## 2012-02-08 LAB — BASIC METABOLIC PANEL
BUN: 23 mg/dL (ref 6–23)
Calcium: 9.1 mg/dL (ref 8.4–10.5)
Creatinine, Ser: 1.3 mg/dL — ABNORMAL HIGH (ref 0.4–1.2)
GFR: 52.34 mL/min — ABNORMAL LOW (ref >60.00)
Glucose, Bld: 215 mg/dL — ABNORMAL HIGH (ref 70–99)
Sodium: 138 mEq/L (ref 135–145)

## 2012-02-08 LAB — LIPID PANEL
Cholesterol: 125 mg/dL (ref 0–200)
HDL: 43.6 mg/dL (ref >39.00)
Triglycerides: 102 mg/dL (ref 0.0–149.0)
VLDL: 20.4 mg/dL (ref 0.0–40.0)

## 2012-02-08 LAB — HEMOGLOBIN A1C: Hgb A1c MFr Bld: 7.2 % — ABNORMAL HIGH (ref 4.6–6.5)

## 2012-02-08 LAB — TSH: TSH: 1.43 u[IU]/mL (ref 0.35–5.50)

## 2012-02-09 ENCOUNTER — Encounter: Payer: Self-pay | Admitting: Internal Medicine

## 2012-02-09 ENCOUNTER — Ambulatory Visit (INDEPENDENT_AMBULATORY_CARE_PROVIDER_SITE_OTHER): Payer: Medicare Other | Admitting: Internal Medicine

## 2012-02-09 VITALS — BP 142/52 | HR 74 | Temp 97.9°F | Ht 62.5 in | Wt 202.2 lb

## 2012-02-09 DIAGNOSIS — IMO0001 Reserved for inherently not codable concepts without codable children: Secondary | ICD-10-CM

## 2012-02-09 NOTE — Patient Instructions (Signed)
none

## 2012-02-09 NOTE — Progress Notes (Signed)
  Subjective:    Patient ID: Alexandra Gross, female    DOB: 07/05/1928, 78 y.o.   MRN: [id number]  HPI  Unfort pt left before being seen, not clear as to reason to me    Review of Systems     Objective:   Physical Exam        Assessment & Plan:

## 2012-02-09 NOTE — Assessment & Plan Note (Signed)
Pt not seen today, will try to ask to re-shcedule

## 2012-04-11 ENCOUNTER — Other Ambulatory Visit: Payer: Self-pay | Admitting: Internal Medicine

## 2012-05-26 ENCOUNTER — Ambulatory Visit (INDEPENDENT_AMBULATORY_CARE_PROVIDER_SITE_OTHER): Payer: Medicare Other | Admitting: Internal Medicine

## 2012-05-26 ENCOUNTER — Encounter: Payer: Self-pay | Admitting: Internal Medicine

## 2012-05-26 VITALS — BP 142/60 | HR 76 | Temp 98.5°F | Ht 62.5 in | Wt 206.4 lb

## 2012-05-26 DIAGNOSIS — R600 Localized edema: Secondary | ICD-10-CM | POA: Insufficient documentation

## 2012-05-26 DIAGNOSIS — I10 Essential (primary) hypertension: Secondary | ICD-10-CM

## 2012-05-26 DIAGNOSIS — R609 Edema, unspecified: Secondary | ICD-10-CM

## 2012-05-26 DIAGNOSIS — N189 Chronic kidney disease, unspecified: Secondary | ICD-10-CM

## 2012-05-26 MED ORDER — HYDROCHLOROTHIAZIDE 25 MG PO TABS: 25.0000 mg | ORAL_TABLET | Freq: Every day | ORAL | Status: DC

## 2012-05-26 NOTE — Assessment & Plan Note (Signed)
stable overall by history and exam, recent data reviewed with pt, and pt to continue medical treatment as before,  to f/u any worsening symptoms or concerns Lab Results  Component Value Date   HGBA1C 7.2* 02/08/2012

## 2012-05-26 NOTE — Assessment & Plan Note (Addendum)
Mild in the setting of ckd and "plenty of water" po recently, some LE varicosities/venous insuff ; for reduced po fluid intake, cont same meds, elev legs, low salt diet, consider compression stockings, and hct 25 qd prn,  to f/u any worsening symptoms or concerns, consider echo but declines today

## 2012-05-26 NOTE — Progress Notes (Signed)
Subjective:    Patient ID: Alexandra Gross, female    DOB: 07/05/1928, 79 y.o.   MRN: [id number]  HPI here to f/u, c/o 3 wks onset worsenng peripheral edema, has been drinking "plenty of water" recently as well b/c she thought she should, Overall good compliance with treatment, and good medicine tolerability. Has hx of CKD.  Pt denies chest pain, increased sob or doe, wheezing, orthopnea, PND, increased LE swelling, palpitations, dizziness or syncope except for the above.  Pt denies new neurological symptoms such as new headache, or facial or extremity weakness or numbness   Pt denies polydipsia, polyuria.  No recent med changes. No fever or leg redness.  Swelling improves overnight, then worse by later in the day, has had nocturia 2-3 times in past few wks as well. Past Medical History  Diagnosis Date  . Hypertension   . Diabetes mellitus   . Obesity   . Arthritis   . Hyperlipidemia 08/18/2011  . B12 deficiency 08/18/2011   Past Surgical History  Procedure Laterality Date  . Appendectomy    . Tumor removed      Abdomen (benign)  . Back surgery    . Abdominal hysterectomy  1972    reports that she quit smoking about 19 years ago. Her smoking use included Cigarettes. She smoked 0.00 packs per day. She does not have any smokeless tobacco history on file. She reports that she does not drink alcohol or use illicit drugs. family history includes Arthritis in her mother; Diabetes in her mother; and Hypertension in her mother. Allergies  Allergen Reactions  . Lisinopril Other (See Comments)    unknown  . Penicillins Other (See Comments)    unknown   Current Outpatient Prescriptions on File Prior to Visit  Medication Sig Dispense Refill  . amLODipine (NORVASC) 5 MG tablet Take 1 tablet (5 mg total) by mouth daily.  30 tablet  11  . atorvastatin (LIPITOR) 20 MG tablet Take 1 tablet (20 mg total) by mouth daily.  90 tablet  3  . COD LIVER OIL PO Take 1 capsule by mouth daily.        Marland Kitchen DIOVAN 80  MG tablet TAKE ONE (1) TABLET EACH DAY  30 tablet  5  . metFORMIN (GLUMETZA) 500 MG (MOD) 24 hr tablet Take 1 tablet (500 mg total) by mouth 2 (two) times daily with a meal.  60 tablet  11  . vitamin B-12 (CYANOCOBALAMIN) 500 MCG tablet Take 500 mcg by mouth daily.      Marland Kitchen VITAMIN E PO Take 1 capsule by mouth daily.        . nitroGLYCERIN (NITROSTAT) 0.4 MG SL tablet Place 1 tablet (0.4 mg total) under the tongue every 5 (five) minutes as needed for chest pain.  25 tablet  11   No current facility-administered medications on file prior to visit.   Review of Systems  Constitutional: Negative for unexpected weight change, or unusual diaphoresis  HENT: Negative for tinnitus.   Eyes: Negative for photophobia and visual disturbance.  Respiratory: Negative for choking and stridor.   Gastrointestinal: Negative for vomiting and blood in stool.  Genitourinary: Negative for hematuria and decreased urine volume.  Musculoskeletal: Negative for acute joint swelling Skin: Negative for color change and wound.  Neurological: Negative for tremors and numbness other than noted  Psychiatric/Behavioral: Negative for decreased concentration or  hyperactivity.       Objective:   Physical Exam BP 142/60  Pulse 76  Temp(Src) 98.5 F (  36.9 C) (Oral)  Ht 5' 2.5" (1.588 m)  Wt 206 lb 6 oz (93.611 kg)  BMI 37.12 kg/m2  SpO2 97% VS noted,  Constitutional: Pt appears well-developed and well-nourished.  HENT: Head: NCAT.  Right Ear: External ear normal.  Left Ear: External ear normal.  Eyes: Conjunctivae and EOM are normal. Pupils are equal, round, and reactive to light.  Neck: Normal range of motion. Neck supple.  Cardiovascular: Normal rate and regular rhythm.   Pulmonary/Chest: Effort normal and breath sounds normal.  Neurological: Pt is alert. Not confused , motor intact Skin: Skin is warm. No erythema. LE's with bialt trace to 1+ edema, several varicosities, no knee effusions Psychiatric: Pt behavior  is normal. Thought content normal.     Assessment & Plan:

## 2012-05-26 NOTE — Assessment & Plan Note (Signed)
stable overall by history and exam, recent data reviewed with pt, and pt to continue medical treatment as before,  to f/u any worsening symptoms or concerns BP Readings from Last 3 Encounters:  05/26/12 142/60  02/09/12 142/52  08/18/11 122/62

## 2012-05-26 NOTE — Assessment & Plan Note (Signed)
,   recent data reviewed with pt, and pt to continue medical treatment as before,  to f/u any worsening symptoms or concerns Lab Results  Component Value Date   CREATININE 1.3* 02/08/2012

## 2012-05-26 NOTE — Patient Instructions (Addendum)
Please take all new medication as prescribed - the fluid pill as needed Please continue all other medications as before Please have the pharmacy call with any other refills you may need. Please keep your legs elevated when sitting if you can Please follow low sodium diet Please only fluids if you are thirsty Please remember to sign up for My Chart if you have not done so, as this will be important to you in the future with finding out test results, communicating by private email, and scheduling acute appointments online when needed. Please return in 3 months, or sooner if needed, with Lab testing done 3-5 days before

## 2012-08-25 ENCOUNTER — Encounter: Payer: Self-pay | Admitting: Internal Medicine

## 2012-08-25 ENCOUNTER — Ambulatory Visit (INDEPENDENT_AMBULATORY_CARE_PROVIDER_SITE_OTHER): Payer: Medicare Other | Admitting: Internal Medicine

## 2012-08-25 ENCOUNTER — Other Ambulatory Visit (INDEPENDENT_AMBULATORY_CARE_PROVIDER_SITE_OTHER): Payer: Medicare Other

## 2012-08-25 VITALS — BP 130/70 | HR 76 | Temp 98.8°F | Wt 201.0 lb

## 2012-08-25 DIAGNOSIS — E785 Hyperlipidemia, unspecified: Secondary | ICD-10-CM

## 2012-08-25 DIAGNOSIS — R609 Edema, unspecified: Secondary | ICD-10-CM

## 2012-08-25 DIAGNOSIS — Z0000 Encounter for general adult medical examination without abnormal findings: Secondary | ICD-10-CM

## 2012-08-25 DIAGNOSIS — I10 Essential (primary) hypertension: Secondary | ICD-10-CM

## 2012-08-25 LAB — HEMOGLOBIN A1C: Hgb A1c MFr Bld: 7.3 % — ABNORMAL HIGH (ref 4.6–6.5)

## 2012-08-25 LAB — BASIC METABOLIC PANEL
BUN: 22 mg/dL (ref 6–23)
CO2: 26 mEq/L (ref 19–32)
Chloride: 113 mEq/L — ABNORMAL HIGH (ref 96–112)
Creatinine, Ser: 1.4 mg/dL — ABNORMAL HIGH (ref 0.4–1.2)
Potassium: 4.8 mEq/L (ref 3.5–5.1)

## 2012-08-25 LAB — LIPID PANEL
Cholesterol: 114 mg/dL (ref 0–200)
Triglycerides: 117 mg/dL (ref 0.0–149.0)

## 2012-08-25 LAB — HEPATIC FUNCTION PANEL
ALT: 29 U/L (ref 0–35)
AST: 28 U/L (ref 0–37)
Bilirubin, Direct: 0.1 mg/dL (ref 0.0–0.3)
Total Protein: 7.7 g/dL (ref 6.0–8.3)

## 2012-08-25 NOTE — Assessment & Plan Note (Signed)
stable overall by history and exam, recent data reviewed with pt, and pt to continue medical treatment as before,  to f/u any worsening symptoms or concerns BP Readings from Last 3 Encounters:  08/25/12 130/70  05/26/12 142/60  02/09/12 142/52

## 2012-08-25 NOTE — Progress Notes (Signed)
Subjective:    Patient ID: Alexandra Gross, female    DOB: 07/05/1928, 79 y.o.   MRN: [id number]  HPI  Here to f/u; overall doing ok,  Pt denies chest pain, increased sob or doe, wheezing, orthopnea, PND, increased LE swelling, palpitations, dizziness or syncope.  Pt denies polydipsia, polyuria, or low sugar symptoms such as weakness or confusion improved with po intake.  Pt denies new neurological symptoms such as new headache, or facial or extremity weakness or numbness.   Pt states overall good compliance with meds, has been trying to follow lower cholesterol, diabetic diet, with wt overall stable,  but little exercise however, trying to do better Past Medical History  Diagnosis Date  . Hypertension   . Diabetes mellitus   . Obesity   . Arthritis   . Hyperlipidemia 08/18/2011  . B12 deficiency 08/18/2011   Past Surgical History  Procedure Laterality Date  . Appendectomy    . Tumor removed      Abdomen (benign)  . Back surgery    . Abdominal hysterectomy  1972    reports that she quit smoking about 19 years ago. Her smoking use included Cigarettes. She smoked 0.00 packs per day. She does not have any smokeless tobacco history on file. She reports that she does not drink alcohol or use illicit drugs. family history includes Arthritis in her mother; Diabetes in her mother; and Hypertension in her mother. Allergies  Allergen Reactions  . Lisinopril Other (See Comments)    unknown  . Penicillins Other (See Comments)    unknown   Current Outpatient Prescriptions on File Prior to Visit  Medication Sig Dispense Refill  . amLODipine (NORVASC) 5 MG tablet Take 1 tablet (5 mg total) by mouth daily.  30 tablet  11  . COD LIVER OIL PO Take 1 capsule by mouth daily.        Marland Kitchen DIOVAN 80 MG tablet TAKE ONE (1) TABLET EACH DAY  30 tablet  5  . hydrochlorothiazide (HYDRODIURIL) 25 MG tablet Take 1 tablet (25 mg total) by mouth daily. As needed for swelling  90 tablet  3  . metFORMIN (GLUMETZA) 500 MG  (MOD) 24 hr tablet Take 1 tablet (500 mg total) by mouth 2 (two) times daily with a meal.  60 tablet  11  . vitamin B-12 (CYANOCOBALAMIN) 500 MCG tablet Take 500 mcg by mouth daily.      Marland Kitchen VITAMIN E PO Take 1 capsule by mouth daily.        . nitroGLYCERIN (NITROSTAT) 0.4 MG SL tablet Place 1 tablet (0.4 mg total) under the tongue every 5 (five) minutes as needed for chest pain.  25 tablet  11   No current facility-administered medications on file prior to visit.   Review of Systems  Constitutional: Negative for unexpected weight change, or unusual diaphoresis  HENT: Negative for tinnitus.   Eyes: Negative for photophobia and visual disturbance.  Respiratory: Negative for choking and stridor.   Gastrointestinal: Negative for vomiting and blood in stool.  Genitourinary: Negative for hematuria and decreased urine volume.  Musculoskeletal: Negative for acute joint swelling Skin: Negative for color change and wound.  Neurological: Negative for tremors and numbness other than noted  Psychiatric/Behavioral: Negative for decreased concentration or  hyperactivity.       Objective:   Physical Exam BP 130/70  Pulse 76  Temp(Src) 98.8 F (37.1 C) (Oral)  Wt 201 lb (91.173 kg)  BMI 36.15 kg/m2  SpO2 96% VS noted, not  ill appearing Constitutional: Pt appears well-developed and well-nourished. Alexandra Gross HENT: Head: NCAT.  Right Ear: External ear normal.  Left Ear: External ear normal.  Eyes: Conjunctivae and EOM are normal. Pupils are equal, round, and reactive to light.  Neck: Normal range of motion. Neck supple.  Cardiovascular: Normal rate and regular rhythm.   Pulmonary/Chest: Effort normal and breath sounds normal.  Neurological: Pt is alert. Not confused  Skin: Skin is warm. No erythema. trace pedal edema only today Psychiatric: Pt behavior is normal. Thought content normal.     Assessment & Plan:

## 2012-08-25 NOTE — Assessment & Plan Note (Signed)
stable overall by history and exam, recent data reviewed with pt, and pt to continue medical treatment as before,  to f/u any worsening symptoms or concerns Lab Results  Component Value Date   WBC 10.4 02/08/2012   HGB 11.4* 02/08/2012   HCT 34.3* 02/08/2012   PLT 302.0 02/08/2012   GLUCOSE 215* 02/08/2012   CHOL 125 02/08/2012   TRIG 102.0 02/08/2012   HDL 43.60 02/08/2012   LDLCALC 61 02/08/2012   ALT 27 02/08/2012   AST 26 02/08/2012   NA 138 02/08/2012   K 4.6 02/08/2012   CL 105 02/08/2012   CREATININE 1.3* 02/08/2012   BUN 23 02/08/2012   CO2 26 02/08/2012   TSH 1.43 02/08/2012   INR 0.9 01/05/2011   HGBA1C 7.2* 02/08/2012   MICROALBUR 11.2* 02/08/2012

## 2012-08-25 NOTE — Assessment & Plan Note (Signed)
stable overall by history and exam, recent data reviewed with pt, and pt to continue medical treatment as before,  to f/u any worsening symptoms or concerns Lab Results  Component Value Date   HGBA1C 7.2* 02/08/2012   For lab today

## 2012-08-25 NOTE — Assessment & Plan Note (Signed)
stable overall by history and exam, recent data reviewed with pt, and pt to continue medical treatment as before,  to f/u any worsening symptoms or concerns Lab Results  Component Value Date   LDLCALC 61 02/08/2012   For contd diet, f/u lab

## 2012-08-25 NOTE — Patient Instructions (Signed)
Please continue all other medications as before, and refills have been done if requested. Please continue your efforts at being more active, low cholesterol diet, and weight control. Please go to the LAB in the Basement (turn left off the elevator) for the tests to be done today You will be contacted by phone if any changes need to be made immediately.  Otherwise, you will receive a letter about your results with an explanation, but please check with MyChart first.  Please remember to sign up for My Chart if you have not done so, as this will be important to you in the future with finding out test results, communicating by private email, and scheduling acute appointments online when needed.  Please return in 6 months, or sooner if needed, with Lab testing done 3-5 days before

## 2012-09-14 ENCOUNTER — Other Ambulatory Visit: Payer: Self-pay | Admitting: Internal Medicine

## 2012-10-08 ENCOUNTER — Other Ambulatory Visit: Payer: Self-pay | Admitting: Internal Medicine

## 2012-11-04 ENCOUNTER — Other Ambulatory Visit: Payer: Self-pay | Admitting: Internal Medicine

## 2012-11-11 ENCOUNTER — Encounter: Payer: Self-pay | Admitting: Internal Medicine

## 2012-11-11 ENCOUNTER — Other Ambulatory Visit (INDEPENDENT_AMBULATORY_CARE_PROVIDER_SITE_OTHER): Payer: Medicare Other

## 2012-11-11 ENCOUNTER — Ambulatory Visit (INDEPENDENT_AMBULATORY_CARE_PROVIDER_SITE_OTHER): Payer: Medicare Other | Admitting: Internal Medicine

## 2012-11-11 VITALS — BP 150/70 | HR 79 | Temp 98.4°F | Ht 62.5 in | Wt 206.1 lb

## 2012-11-11 DIAGNOSIS — I10 Essential (primary) hypertension: Secondary | ICD-10-CM

## 2012-11-11 DIAGNOSIS — Z0000 Encounter for general adult medical examination without abnormal findings: Secondary | ICD-10-CM

## 2012-11-11 DIAGNOSIS — IMO0001 Reserved for inherently not codable concepts without codable children: Secondary | ICD-10-CM

## 2012-11-11 LAB — BASIC METABOLIC PANEL
BUN: 16 mg/dL (ref 6–23)
Chloride: 109 mEq/L (ref 96–112)
Potassium: 3.8 mEq/L (ref 3.5–5.1)

## 2012-11-11 LAB — CBC WITH DIFFERENTIAL/PLATELET
Basophils Relative: 0.4 % (ref 0.0–3.0)
Eosinophils Relative: 1.7 % (ref 0.0–5.0)
HCT: 32.9 % — ABNORMAL LOW (ref 36.0–46.0)
Hemoglobin: 10.9 g/dL — ABNORMAL LOW (ref 12.0–15.0)
Lymphs Abs: 3 10*3/uL (ref 0.7–4.0)
MCV: 85.9 fl (ref 78.0–100.0)
Monocytes Absolute: 0.6 10*3/uL (ref 0.1–1.0)
Neutro Abs: 5 10*3/uL (ref 1.4–7.7)
Platelets: 303 10*3/uL (ref 150.0–400.0)
WBC: 8.7 10*3/uL (ref 4.5–10.5)

## 2012-11-11 LAB — MICROALBUMIN / CREATININE URINE RATIO
Creatinine,U: 90.4 mg/dL
Microalb Creat Ratio: 9.5 mg/g (ref 0.0–30.0)

## 2012-11-11 LAB — URINALYSIS, ROUTINE W REFLEX MICROSCOPIC
Bilirubin Urine: NEGATIVE
Ketones, ur: NEGATIVE
Specific Gravity, Urine: 1.025 (ref 1.000–1.030)
Total Protein, Urine: NEGATIVE
Urine Glucose: NEGATIVE
pH: 5.5 (ref 5.0–8.0)

## 2012-11-11 LAB — TSH: TSH: 2.42 u[IU]/mL (ref 0.35–5.50)

## 2012-11-11 LAB — LIPID PANEL
Cholesterol: 125 mg/dL (ref 0–200)
LDL Cholesterol: 51 mg/dL (ref 0–99)

## 2012-11-11 LAB — HEPATIC FUNCTION PANEL
AST: 28 U/L (ref 0–37)
Total Bilirubin: 0.6 mg/dL (ref 0.3–1.2)

## 2012-11-11 LAB — HEMOGLOBIN A1C: Hgb A1c MFr Bld: 7.2 % — ABNORMAL HIGH (ref 4.6–6.5)

## 2012-11-11 MED ORDER — VALSARTAN 80 MG PO TABS: ORAL_TABLET | ORAL | Status: DC

## 2012-11-11 NOTE — Assessment & Plan Note (Signed)
stable overall by history and exam, recent data reviewed with pt, and pt to continue medical treatment as before,  to f/u any worsening symptoms or concerns Lab Results  Component Value Date   HGBA1C 7.3* 08/25/2012

## 2012-11-11 NOTE — Progress Notes (Signed)
Subjective:    Patient ID: Alexandra Gross, female    DOB: 07/05/1928, 79 y.o.   MRN: [id number]  HPI  Here for wellness and f/u;  Overall doing ok;  Pt denies CP, worsening SOB, DOE, wheezing, orthopnea, PND, worsening LE edema, palpitations, dizziness or syncope.  Pt denies neurological change such as new headache, facial or extremity weakness.  Pt denies polydipsia, polyuria, or low sugar symptoms. Pt states overall good compliance with treatment and medications, good tolerability, and has been trying to follow lower cholesterol diet.  Pt denies worsening depressive symptoms, suicidal ideation or panic. No fever, night sweats, wt loss, loss of appetite, or other constitutional symptoms.  Pt states good ability with ADL's, has low fall risk, home safety reviewed and adequate, no other significant changes in hearing or vision, and only occasionally active with exercise. Decliens flu shot. Still trying to lose wt.  Ran out of diovan 3 days ago, still taking the other. No acute complaitns.  Tries to stay busy with crochet and crossword puzzles.  Trying to lose more wt.  Declines all immunizations Past Medical History  Diagnosis Date  . Hypertension   . Diabetes mellitus   . Obesity   . Arthritis   . Hyperlipidemia 08/18/2011  . B12 deficiency 08/18/2011   Past Surgical History  Procedure Laterality Date  . Appendectomy    . Tumor removed      Abdomen (benign)  . Back surgery    . Abdominal hysterectomy  1972    reports that she quit smoking about 19 years ago. Her smoking use included Cigarettes. She smoked 0.00 packs per day. She does not have any smokeless tobacco history on file. She reports that she does not drink alcohol or use illicit drugs. family history includes Arthritis in her mother; Diabetes in her mother; Hypertension in her mother. Allergies  Allergen Reactions  . Lisinopril Other (See Comments)    unknown  . Penicillins Other (See Comments)    unknown   Current Outpatient  Prescriptions on File Prior to Visit  Medication Sig Dispense Refill  . amLODipine (NORVASC) 5 MG tablet Take 1 tablet (5 mg total) by mouth daily.  30 tablet  11  . aspirin 81 MG tablet Take 81 mg by mouth daily.      Marland Kitchen atorvastatin (LIPITOR) 20 MG tablet TAKE ONE (1) TABLET EACH DAY  90 tablet  3  . COD LIVER OIL PO Take 1 capsule by mouth daily.        . hydrochlorothiazide (HYDRODIURIL) 25 MG tablet Take 1 tablet (25 mg total) by mouth daily. As needed for swelling  90 tablet  3  . metFORMIN (GLUCOPHAGE) 500 MG tablet TAKE ONE TABLET TWICE DAILY  60 tablet  11  . metFORMIN (GLUMETZA) 500 MG (MOD) 24 hr tablet Take 1 tablet (500 mg total) by mouth 2 (two) times daily with a meal.  60 tablet  11  . valsartan (DIOVAN) 80 MG tablet TAKE ONE TABLET DAILY  30 tablet  11  . vitamin B-12 (CYANOCOBALAMIN) 500 MCG tablet Take 500 mcg by mouth daily.      Marland Kitchen VITAMIN E PO Take 1 capsule by mouth daily.        . nitroGLYCERIN (NITROSTAT) 0.4 MG SL tablet Place 1 tablet (0.4 mg total) under the tongue every 5 (five) minutes as needed for chest pain.  25 tablet  11   No current facility-administered medications on file prior to visit.   Review of Systems  Constitutional: Negative for diaphoresis, activity change, appetite change or unexpected weight change.  HENT: Negative for hearing loss, ear pain, facial swelling, mouth sores and neck stiffness.   Eyes: Negative for pain, redness and visual disturbance.  Respiratory: Negative for shortness of breath and wheezing.   Cardiovascular: Negative for chest pain and palpitations.  Gastrointestinal: Negative for diarrhea, blood in stool, abdominal distention or other pain Genitourinary: Negative for hematuria, flank pain or change in urine volume.  Musculoskeletal: Negative for myalgias and joint swelling.  Skin: Negative for color change and wound.  Neurological: Negative for syncope and numbness. other than noted Hematological: Negative for adenopathy.   Psychiatric/Behavioral: Negative for hallucinations, self-injury, decreased concentration and agitation.      Objective:   Physical Exam BP 150/70  Pulse 79  Temp(Src) 98.4 F (36.9 C) (Oral)  Ht 5' 2.5" (1.588 m)  Wt 206 lb 2 oz (93.498 kg)  BMI 37.08 kg/m2  SpO2 97% VS noted,  Constitutional: Pt is oriented to person, place, and time. Appears well-developed and well-nourished.  Head: Normocephalic and atraumatic.  Right Ear: External ear normal.  Left Ear: External ear normal.  Nose: Nose normal.  Mouth/Throat: Oropharynx is clear and moist.  Eyes: Conjunctivae and EOM are normal. Pupils are equal, round, and reactive to light.  Neck: Normal range of motion. Neck supple. No JVD present. No tracheal deviation present.  Cardiovascular: Normal rate, regular rhythm, normal heart sounds and intact distal pulses.   Pulmonary/Chest: Effort normal and breath sounds normal.  Abdominal: Soft. Bowel sounds are normal. There is no tenderness. No HSM  Musculoskeletal: Normal range of motion. Exhibits no edema.  Lymphadenopathy:  Has no cervical adenopathy.  Neurological: Pt is alert and oriented to person, place, and time. Pt has normal reflexes. No cranial nerve deficit.  Skin: Skin is warm and dry. No rash noted.  Psychiatric:  Has  normal mood and affect. Behavior is normal.     Assessment & Plan:

## 2012-11-11 NOTE — Patient Instructions (Signed)
Please continue all other medications as before, and refills have been done if requested. Please continue your efforts at being more active, low cholesterol diet, and weight control. You are otherwise up to date with prevention measures today.  Please go to the LAB in the Basement (turn left off the elevator) for the tests to be done today  You will be contacted by phone if any changes need to be made immediately.  Otherwise, you will receive a letter about your results with an explanation, but please check with MyChart first.  Please return in 6 months, or sooner if needed, with Lab testing done 3-5 days before

## 2012-11-11 NOTE — Assessment & Plan Note (Signed)
Mild elev, out of diovan, ok to re-start, cont all other med, cont monitor BP at home, to f/u any worsening symptoms or concerns BP Readings from Last 3 Encounters:  11/11/12 150/70  08/25/12 130/70  05/26/12 142/60

## 2012-11-11 NOTE — Assessment & Plan Note (Signed)

## 2012-12-29 ENCOUNTER — Other Ambulatory Visit: Payer: Self-pay | Admitting: Internal Medicine

## 2013-02-27 ENCOUNTER — Ambulatory Visit: Payer: Medicare Other | Admitting: Internal Medicine

## 2013-05-10 ENCOUNTER — Other Ambulatory Visit (INDEPENDENT_AMBULATORY_CARE_PROVIDER_SITE_OTHER): Payer: Medicare Other

## 2013-05-10 DIAGNOSIS — IMO0001 Reserved for inherently not codable concepts without codable children: Secondary | ICD-10-CM

## 2013-05-10 DIAGNOSIS — E1165 Type 2 diabetes mellitus with hyperglycemia: Principal | ICD-10-CM

## 2013-05-10 LAB — HEPATIC FUNCTION PANEL
ALT: 33 U/L (ref 0–35)
AST: 25 U/L (ref 0–37)
Albumin: 3.9 g/dL (ref 3.5–5.2)
Alkaline Phosphatase: 67 U/L (ref 39–117)
BILIRUBIN TOTAL: 0.4 mg/dL (ref 0.3–1.2)
Bilirubin, Direct: 0.1 mg/dL (ref 0.0–0.3)
Total Protein: 7.8 g/dL (ref 6.0–8.3)

## 2013-05-10 LAB — LIPID PANEL
CHOL/HDL RATIO: 3
CHOLESTEROL: 156 mg/dL (ref 0–200)
HDL: 49.6 mg/dL (ref >39.00)
LDL CALC: 69 mg/dL (ref 0–99)
Triglycerides: 186 mg/dL — ABNORMAL HIGH (ref 0.0–149.0)
VLDL: 37.2 mg/dL (ref 0.0–40.0)

## 2013-05-10 LAB — BASIC METABOLIC PANEL
BUN: 26 mg/dL — AB (ref 6–23)
CO2: 21 mEq/L (ref 19–32)
Calcium: 9 mg/dL (ref 8.4–10.5)
Chloride: 105 mEq/L (ref 96–112)
Creatinine, Ser: 1.2 mg/dL (ref 0.4–1.2)
GFR: 55.73 mL/min — AB (ref >60.00)
Glucose, Bld: 154 mg/dL — ABNORMAL HIGH (ref 70–99)
POTASSIUM: 4.3 meq/L (ref 3.5–5.1)
SODIUM: 137 meq/L (ref 135–145)

## 2013-05-10 LAB — HEMOGLOBIN A1C: Hgb A1c MFr Bld: 7.6 % — ABNORMAL HIGH (ref 4.6–6.5)

## 2013-05-11 ENCOUNTER — Ambulatory Visit (INDEPENDENT_AMBULATORY_CARE_PROVIDER_SITE_OTHER): Payer: Medicare Other | Admitting: Internal Medicine

## 2013-05-11 ENCOUNTER — Encounter: Payer: Self-pay | Admitting: Internal Medicine

## 2013-05-11 VITALS — BP 150/70 | HR 76 | Temp 98.3°F | Wt 207.0 lb

## 2013-05-11 DIAGNOSIS — R609 Edema, unspecified: Secondary | ICD-10-CM

## 2013-05-11 DIAGNOSIS — Z0000 Encounter for general adult medical examination without abnormal findings: Secondary | ICD-10-CM

## 2013-05-11 DIAGNOSIS — I10 Essential (primary) hypertension: Secondary | ICD-10-CM

## 2013-05-11 DIAGNOSIS — E1165 Type 2 diabetes mellitus with hyperglycemia: Secondary | ICD-10-CM

## 2013-05-11 DIAGNOSIS — IMO0001 Reserved for inherently not codable concepts without codable children: Secondary | ICD-10-CM

## 2013-05-11 DIAGNOSIS — E785 Hyperlipidemia, unspecified: Secondary | ICD-10-CM

## 2013-05-11 MED ORDER — AMLODIPINE BESYLATE 5 MG PO TABS: 5.0000 mg | ORAL_TABLET | Freq: Every day | ORAL | Status: DC

## 2013-05-11 NOTE — Progress Notes (Signed)
Pre visit review using our clinic review tool, if applicable. No additional management support is needed unless otherwise documented below in the visit note. 

## 2013-05-11 NOTE — Assessment & Plan Note (Signed)
Likely related to incr amlodipine - to d/c amlod 10

## 2013-05-11 NOTE — Patient Instructions (Signed)
Ok to stop the amlodipine 10 mg per day  Please take all new medication as prescribed - the amlodipine 5 mg per day  Please continue all other medications as before, and refills have been done if requested. Please have the pharmacy call with any other refills you may need.  Please continue your efforts at being more active, low cholesterol diet, and weight control.  Please keep your appointments with your specialists as you may have planned  Please return in 6 months, or sooner if needed, with Lab testing done 3-5 days before

## 2013-05-11 NOTE — Assessment & Plan Note (Signed)
stable overall by history and exam, recent data reviewed with pt, and pt to continue medical treatment as before,  to f/u any worsening symptoms or concerns Lab Results  Component Value Date   LDLCALC 69 05/10/2013

## 2013-05-11 NOTE — Assessment & Plan Note (Signed)
Mild uncontrolled, pt declines further change in meds today, to work on diet, wt loss

## 2013-05-11 NOTE — Assessment & Plan Note (Signed)
Mild uncontrolled, but o/w stable overall by history and exam, recent data reviewed with pt, and pt to continue medical treatment as before,  to f/u any worsening symptoms or concerns BP Readings from Last 3 Encounters:  05/11/13 150/70  11/11/12 150/70  08/25/12 130/70

## 2013-05-11 NOTE — Progress Notes (Signed)
Subjective:    Patient ID: Alexandra Gross, female    DOB: 07/05/1928, 80 y.o.   MRN: [id number]  HPI  Here to f/u; overall doing ok,  Pt denies chest pain, increased sob or doe, wheezing, orthopnea, PND, palpitations, dizziness or syncope, but did have onset new very mild LE swelling, has been on increased amlod to 10 mg since nov 2014.Marland Kitchen  Pt denies polydipsia, polyuria, or low sugar symptoms such as weakness or confusion improved with po intake.  Pt denies new neurological symptoms such as new headache, or facial or extremity weakness or numbness.   Pt states overall good compliance with meds, has been trying to follow lower cholesterol, diabetic diet, with wt down a few lbs per pt.  States she thinks BP up just b/c she is here, and delcines further incr diovan. Past Medical History  Diagnosis Date  . Hypertension   . Diabetes mellitus   . Obesity   . Arthritis   . Hyperlipidemia 08/18/2011  . B12 deficiency 08/18/2011   Past Surgical History  Procedure Laterality Date  . Appendectomy    . Tumor removed      Abdomen (benign)  . Back surgery    . Abdominal hysterectomy  1972    reports that she quit smoking about 20 years ago. Her smoking use included Cigarettes. She smoked 0.00 packs per day. She does not have any smokeless tobacco history on file. She reports that she does not drink alcohol or use illicit drugs. family history includes Arthritis in her mother; Diabetes in her mother; Hypertension in her mother. Allergies  Allergen Reactions  . Lisinopril Other (See Comments)    unknown  . Penicillins Other (See Comments)    unknown   Current Outpatient Prescriptions on File Prior to Visit  Medication Sig Dispense Refill  . aspirin 81 MG tablet Take 81 mg by mouth daily.      Marland Kitchen atorvastatin (LIPITOR) 20 MG tablet TAKE ONE (1) TABLET EACH DAY  90 tablet  3  . COD LIVER OIL PO Take 1 capsule by mouth daily.        . hydrochlorothiazide (HYDRODIURIL) 25 MG tablet Take 1 tablet (25 mg  total) by mouth daily. As needed for swelling  90 tablet  3  . metFORMIN (GLUCOPHAGE) 500 MG tablet TAKE ONE TABLET TWICE DAILY  60 tablet  11  . valsartan (DIOVAN) 80 MG tablet TAKE ONE TABLET DAILY  30 tablet  11  . vitamin B-12 (CYANOCOBALAMIN) 500 MCG tablet Take 500 mcg by mouth daily.      Marland Kitchen VITAMIN E PO Take 1 capsule by mouth daily.        . nitroGLYCERIN (NITROSTAT) 0.4 MG SL tablet Place 1 tablet (0.4 mg total) under the tongue every 5 (five) minutes as needed for chest pain.  25 tablet  11   No current facility-administered medications on file prior to visit.   Review of Systems  Constitutional: Negative for unexpected weight change, or unusual diaphoresis  HENT: Negative for tinnitus.   Eyes: Negative for photophobia and visual disturbance.  Respiratory: Negative for choking and stridor.   Gastrointestinal: Negative for vomiting and blood in stool.  Genitourinary: Negative for hematuria and decreased urine volume.  Musculoskeletal: Negative for acute joint swelling Skin: Negative for color change and wound.  Neurological: Negative for tremors and numbness other than noted  Psychiatric/Behavioral: Negative for decreased concentration or  hyperactivity.       Objective:   Physical Exam BP  150/70  Pulse 76  Temp(Src) 98.3 F (36.8 C) (Oral)  Wt 207 lb (93.895 kg)  SpO2 96% VS noted,  Constitutional: Pt appears well-developed and well-nourished.  HENT: Head: NCAT.  Right Ear: External ear normal.  Left Ear: External ear normal.  Eyes: Conjunctivae and EOM are normal. Pupils are equal, round, and reactive to light.  Neck: Normal range of motion. Neck supple.  Cardiovascular: Normal rate and regular rhythm.   Pulmonary/Chest: Effort normal and breath sounds normal.  Abd:  Soft, NT, non-distended, + BS Neurological: Pt is alert. Not confused  Skin: Skin is warm. No erythema. trace ankle edema bilat Psychiatric: Pt behavior is normal. Thought content normal.        Assessment & Plan:

## 2013-05-12 ENCOUNTER — Telehealth: Payer: Self-pay | Admitting: Internal Medicine

## 2013-05-12 NOTE — Telephone Encounter (Signed)
Relevant patient education mailed to patient.  

## 2013-06-14 ENCOUNTER — Other Ambulatory Visit (INDEPENDENT_AMBULATORY_CARE_PROVIDER_SITE_OTHER): Payer: Medicare Other

## 2013-06-14 DIAGNOSIS — E1165 Type 2 diabetes mellitus with hyperglycemia: Principal | ICD-10-CM

## 2013-06-14 DIAGNOSIS — IMO0001 Reserved for inherently not codable concepts without codable children: Secondary | ICD-10-CM

## 2013-06-14 DIAGNOSIS — I10 Essential (primary) hypertension: Secondary | ICD-10-CM

## 2013-06-14 DIAGNOSIS — Z0000 Encounter for general adult medical examination without abnormal findings: Secondary | ICD-10-CM

## 2013-06-14 LAB — MICROALBUMIN / CREATININE URINE RATIO
CREATININE, U: 79.8 mg/dL
MICROALB/CREAT RATIO: 17.7 mg/g (ref 0.0–30.0)
Microalb, Ur: 14.1 mg/dL — ABNORMAL HIGH (ref 0.0–1.9)

## 2013-06-14 LAB — URINALYSIS, ROUTINE W REFLEX MICROSCOPIC
Bilirubin Urine: NEGATIVE
KETONES UR: NEGATIVE
LEUKOCYTES UA: NEGATIVE
NITRITE: NEGATIVE
Specific Gravity, Urine: 1.015 (ref 1.000–1.030)
UROBILINOGEN UA: 0.2 (ref 0.0–1.0)
Urine Glucose: NEGATIVE
WBC, UA: NONE SEEN (ref 0–?)
pH: 5.5 (ref 5.0–8.0)

## 2013-06-14 LAB — HEPATIC FUNCTION PANEL
ALBUMIN: 3.4 g/dL — AB (ref 3.5–5.2)
ALK PHOS: 60 U/L (ref 39–117)
ALT: 30 U/L (ref 0–35)
AST: 29 U/L (ref 0–37)
Bilirubin, Direct: 0.1 mg/dL (ref 0.0–0.3)
TOTAL PROTEIN: 7 g/dL (ref 6.0–8.3)
Total Bilirubin: 0.5 mg/dL (ref 0.3–1.2)

## 2013-06-14 LAB — CBC WITH DIFFERENTIAL/PLATELET
BASOS ABS: 0.1 10*3/uL (ref 0.0–0.1)
Basophils Relative: 0.5 % (ref 0.0–3.0)
Eosinophils Absolute: 0.2 10*3/uL (ref 0.0–0.7)
Eosinophils Relative: 1.7 % (ref 0.0–5.0)
HCT: 32.2 % — ABNORMAL LOW (ref 36.0–46.0)
Hemoglobin: 10.8 g/dL — ABNORMAL LOW (ref 12.0–15.0)
LYMPHS ABS: 3.9 10*3/uL (ref 0.7–4.0)
LYMPHS PCT: 36.7 % (ref 12.0–46.0)
MCHC: 33.5 g/dL (ref 30.0–36.0)
MCV: 88.9 fl (ref 78.0–100.0)
Monocytes Absolute: 0.8 10*3/uL (ref 0.1–1.0)
Monocytes Relative: 7.7 % (ref 3.0–12.0)
NEUTROS PCT: 53.4 % (ref 43.0–77.0)
Neutro Abs: 5.7 10*3/uL (ref 1.4–7.7)
Platelets: 261 10*3/uL (ref 150.0–400.0)
RBC: 3.62 Mil/uL — ABNORMAL LOW (ref 3.87–5.11)
RDW: 13.8 % (ref 11.5–14.6)
WBC: 10.8 10*3/uL — ABNORMAL HIGH (ref 4.5–10.5)

## 2013-06-14 LAB — LIPID PANEL
Cholesterol: 118 mg/dL (ref 0–200)
HDL: 43.9 mg/dL (ref >39.00)
LDL Cholesterol: 44 mg/dL (ref 0–99)
TRIGLYCERIDES: 150 mg/dL — AB (ref 0.0–149.0)
Total CHOL/HDL Ratio: 3
VLDL: 30 mg/dL (ref 0.0–40.0)

## 2013-06-14 LAB — BASIC METABOLIC PANEL
BUN: 21 mg/dL (ref 6–23)
CO2: 24 meq/L (ref 19–32)
Calcium: 9 mg/dL (ref 8.4–10.5)
Chloride: 106 mEq/L (ref 96–112)
Creatinine, Ser: 1.2 mg/dL (ref 0.4–1.2)
GFR: 57.96 mL/min — ABNORMAL LOW (ref >60.00)
GLUCOSE: 214 mg/dL — AB (ref 70–99)
POTASSIUM: 3.8 meq/L (ref 3.5–5.1)
Sodium: 139 mEq/L (ref 135–145)

## 2013-06-14 LAB — HEMOGLOBIN A1C: Hgb A1c MFr Bld: 6.9 % — ABNORMAL HIGH (ref 4.6–6.5)

## 2013-06-14 LAB — TSH: TSH: 1.97 u[IU]/mL (ref 0.35–5.50)

## 2013-06-22 ENCOUNTER — Encounter: Payer: Self-pay | Admitting: Internal Medicine

## 2013-06-22 ENCOUNTER — Ambulatory Visit (INDEPENDENT_AMBULATORY_CARE_PROVIDER_SITE_OTHER): Payer: Medicare Other | Admitting: Internal Medicine

## 2013-06-22 VITALS — BP 142/70 | HR 83 | Temp 98.5°F | Ht 63.0 in | Wt 205.0 lb

## 2013-06-22 DIAGNOSIS — I10 Essential (primary) hypertension: Secondary | ICD-10-CM

## 2013-06-22 DIAGNOSIS — R609 Edema, unspecified: Secondary | ICD-10-CM

## 2013-06-22 DIAGNOSIS — R1032 Left lower quadrant pain: Secondary | ICD-10-CM | POA: Insufficient documentation

## 2013-06-22 DIAGNOSIS — E1165 Type 2 diabetes mellitus with hyperglycemia: Secondary | ICD-10-CM

## 2013-06-22 DIAGNOSIS — IMO0001 Reserved for inherently not codable concepts without codable children: Secondary | ICD-10-CM

## 2013-06-22 MED ORDER — CIPROFLOXACIN HCL 500 MG PO TABS: 500.0000 mg | ORAL_TABLET | Freq: Two times a day (BID) | ORAL | Status: DC

## 2013-06-22 MED ORDER — METRONIDAZOLE 250 MG PO TABS: 250.0000 mg | ORAL_TABLET | Freq: Three times a day (TID) | ORAL | Status: DC

## 2013-06-22 NOTE — Progress Notes (Signed)
Pre visit review using our clinic review tool, if applicable. No additional management support is needed unless otherwise documented below in the visit note. 

## 2013-06-22 NOTE — Patient Instructions (Addendum)
Please take all new medication as prescribed - the two antibiotics  Please continue all other medications as before, and refills have been done if requested. Please have the pharmacy call with any other refills you may need.  Please continue your efforts at being more active, low cholesterol diet, and weight control.  Please go to the LAB in the Basement (turn left off the elevator) for the tests to be done today - just the urine test today  You will be contacted by phone if any changes need to be made immediately.  Otherwise, you will receive a letter about your results with an explanation, but please check with MyChart first.  Please remember to sign up for MyChart if you have not done so, as this will be important to you in the future with finding out test results, communicating by private email, and scheduling acute appointments online when needed.  Please return in 6 months, or sooner if needed

## 2013-06-22 NOTE — Assessment & Plan Note (Signed)
stable overall by history and exam, recent data reviewed with pt, and pt to continue medical treatment as before,  to f/u any worsening symptoms or concerns BP Readings from Last 3 Encounters:  06/22/13 142/70  05/11/13 150/70  11/11/12 150/70

## 2013-06-22 NOTE — Assessment & Plan Note (Signed)
stable overall by history and exam, recent data reviewed with pt, and pt to continue medical treatment as before,  to f/u any worsening symptoms or concerns Lab Results  Component Value Date   HGBA1C 6.9* 06/14/2013

## 2013-06-22 NOTE — Progress Notes (Signed)
Subjective:    Patient ID: Alexandra Gross, female    DOB: 07/05/1928, 80 y.o.   MRN: [id number]  HPI  Here for leg swelling f/u (improved overall), but incidentlly with mod LLQ pain, tenderness gradaully worse since yesterday, without fever, n/v, other abd pain, no radiation, bowel change and Denies worsening reflux, dysphagia, n/v, bowel change or blood.  Denies urinary symptoms such as dysuria, frequency, urgency, flank pain, hematuria or n/v, fever, chills.  No prior hx of diverticulitis, declines colonoscopy. Pt denies chest pain, increased sob or doe, wheezing, orthopnea, PND, increased LE swelling, palpitations, dizziness or syncope.  Pt denies new neurological symptoms such as new headache, or facial or extremity weakness or numbness  Past Medical History  Diagnosis Date  . Hypertension   . Diabetes mellitus   . Obesity   . Arthritis   . Hyperlipidemia 08/18/2011  . B12 deficiency 08/18/2011   Past Surgical History  Procedure Laterality Date  . Appendectomy    . Tumor removed      Abdomen (benign)  . Back surgery    . Abdominal hysterectomy  1972    reports that she quit smoking about 20 years ago. Her smoking use included Cigarettes. She smoked 0.00 packs per day. She does not have any smokeless tobacco history on file. She reports that she does not drink alcohol or use illicit drugs. family history includes Arthritis in her mother; Diabetes in her mother; Hypertension in her mother. Allergies  Allergen Reactions  . Lisinopril Other (See Comments)    unknown  . Penicillins Other (See Comments)    unknown   Current Outpatient Prescriptions on File Prior to Visit  Medication Sig Dispense Refill  . amLODipine (NORVASC) 5 MG tablet Take 1 tablet (5 mg total) by mouth daily.  30 tablet  11  . aspirin 81 MG tablet Take 81 mg by mouth daily.      Marland Kitchen atorvastatin (LIPITOR) 20 MG tablet TAKE ONE (1) TABLET EACH DAY  90 tablet  3  . COD LIVER OIL PO Take 1 capsule by mouth daily.          . hydrochlorothiazide (HYDRODIURIL) 25 MG tablet Take 1 tablet (25 mg total) by mouth daily. As needed for swelling  90 tablet  3  . metFORMIN (GLUCOPHAGE) 500 MG tablet TAKE ONE TABLET TWICE DAILY  60 tablet  11  . valsartan (DIOVAN) 80 MG tablet TAKE ONE TABLET DAILY  30 tablet  11  . vitamin B-12 (CYANOCOBALAMIN) 500 MCG tablet Take 500 mcg by mouth daily.      Marland Kitchen VITAMIN E PO Take 1 capsule by mouth daily.        . nitroGLYCERIN (NITROSTAT) 0.4 MG SL tablet Place 1 tablet (0.4 mg total) under the tongue every 5 (five) minutes as needed for chest pain.  25 tablet  11   No current facility-administered medications on file prior to visit.   Review of Systems  Constitutional: Negative for unusual diaphoresis or other sweats  HENT: Negative for ringing in ear Eyes: Negative for double vision or worsening visual disturbance.  Respiratory: Negative for choking and stridor.   Gastrointestinal: Negative for vomiting or other signifcant bowel change Genitourinary: Negative for hematuria or decreased urine volume.  Musculoskeletal: Negative for other MSK pain or swelling Skin: Negative for color change and worsening wound.  Neurological: Negative for tremors and numbness other than noted  Psychiatric/Behavioral: Negative for decreased concentration or agitation other than above  Objective:   Physical Exam BP 142/70  Pulse 83  Temp(Src) 98.5 F (36.9 C) (Oral)  Ht 5\' 3"  (1.6 m)  Wt 205 lb (92.987 kg)  BMI 36.32 kg/m2  SpO2 93% VS noted,  Constitutional: Pt appears well-developed, well-nourished.  HENT: Head: NCAT.  Right Ear: External ear normal.  Left Ear: External ear normal.  Eyes: . Pupils are equal, round, and reactive to light. Conjunctivae and EOM are normal Neck: Normal range of motion. Neck supple.  Cardiovascular: Normal rate and regular rhythm.   Pulmonary/Chest: Effort normal and breath sounds normal.  Abd:  Soft,  ND, + BS with mod tender LLQ without guarding or  rebound Neurological: Pt is alert. Not confused , motor grossly intact Skin: Skin is warm. No rash LLE trace to 1+ ankle to mid leg edema, RLE - no edema Psychiatric: Pt behavior is normal. No agitation.     Assessment & Plan:

## 2013-06-22 NOTE — Assessment & Plan Note (Signed)
With relatively high suspicion for incidental prob acute diverticulitis, for cipro/flagyl asd, check UA,  to f/u any worsening symptoms or concerns, declines colonoscopy

## 2013-06-22 NOTE — Assessment & Plan Note (Signed)
Stable, cont meds as is with the hct 25 qd, leg elevation , low salt diet, compresion stockings

## 2013-07-03 ENCOUNTER — Telehealth: Payer: Self-pay

## 2013-07-03 NOTE — Telephone Encounter (Signed)
Relevant patient education mailed to patient.  

## 2013-08-31 ENCOUNTER — Other Ambulatory Visit: Payer: Self-pay | Admitting: Internal Medicine

## 2013-09-16 ENCOUNTER — Other Ambulatory Visit: Payer: Self-pay | Admitting: Internal Medicine

## 2013-10-06 ENCOUNTER — Other Ambulatory Visit: Payer: Self-pay | Admitting: Internal Medicine

## 2013-11-05 ENCOUNTER — Other Ambulatory Visit: Payer: Self-pay | Admitting: Internal Medicine

## 2013-11-14 ENCOUNTER — Ambulatory Visit: Payer: Medicare Other | Admitting: Internal Medicine

## 2013-12-08 ENCOUNTER — Other Ambulatory Visit: Payer: Self-pay

## 2013-12-08 MED ORDER — ATORVASTATIN CALCIUM 20 MG PO TABS: ORAL_TABLET | ORAL | Status: DC

## 2013-12-20 ENCOUNTER — Encounter: Payer: Self-pay | Admitting: Internal Medicine

## 2013-12-20 ENCOUNTER — Ambulatory Visit (INDEPENDENT_AMBULATORY_CARE_PROVIDER_SITE_OTHER): Payer: Medicare Other | Admitting: Internal Medicine

## 2013-12-20 ENCOUNTER — Other Ambulatory Visit (INDEPENDENT_AMBULATORY_CARE_PROVIDER_SITE_OTHER): Payer: Medicare Other

## 2013-12-20 VITALS — BP 152/72 | HR 88 | Temp 98.7°F | Wt 200.0 lb

## 2013-12-20 DIAGNOSIS — E119 Type 2 diabetes mellitus without complications: Secondary | ICD-10-CM

## 2013-12-20 DIAGNOSIS — Z0189 Encounter for other specified special examinations: Secondary | ICD-10-CM

## 2013-12-20 DIAGNOSIS — I10 Essential (primary) hypertension: Secondary | ICD-10-CM

## 2013-12-20 DIAGNOSIS — E785 Hyperlipidemia, unspecified: Secondary | ICD-10-CM

## 2013-12-20 DIAGNOSIS — Z23 Encounter for immunization: Secondary | ICD-10-CM

## 2013-12-20 DIAGNOSIS — Z0000 Encounter for general adult medical examination without abnormal findings: Secondary | ICD-10-CM

## 2013-12-20 LAB — BASIC METABOLIC PANEL
BUN: 28 mg/dL — AB (ref 6–23)
CHLORIDE: 108 meq/L (ref 96–112)
CO2: 23 mEq/L (ref 19–32)
Calcium: 8.8 mg/dL (ref 8.4–10.5)
Creatinine, Ser: 1.4 mg/dL — ABNORMAL HIGH (ref 0.4–1.2)
GFR: 47.7 mL/min — ABNORMAL LOW (ref >60.00)
GLUCOSE: 80 mg/dL (ref 70–99)
POTASSIUM: 4 meq/L (ref 3.5–5.1)
Sodium: 136 mEq/L (ref 135–145)

## 2013-12-20 LAB — LIPID PANEL
CHOLESTEROL: 132 mg/dL (ref 0–200)
HDL: 45.2 mg/dL (ref >39.00)
LDL CALC: 58 mg/dL (ref 0–99)
NONHDL: 86.8
Total CHOL/HDL Ratio: 3
Triglycerides: 142 mg/dL (ref 0.0–149.0)
VLDL: 28.4 mg/dL (ref 0.0–40.0)

## 2013-12-20 LAB — HEPATIC FUNCTION PANEL
ALBUMIN: 3.3 g/dL — AB (ref 3.5–5.2)
ALT: 26 U/L (ref 0–35)
AST: 28 U/L (ref 0–37)
Alkaline Phosphatase: 61 U/L (ref 39–117)
BILIRUBIN TOTAL: 0.7 mg/dL (ref 0.2–1.2)
Bilirubin, Direct: 0.1 mg/dL (ref 0.0–0.3)
Total Protein: 7.6 g/dL (ref 6.0–8.3)

## 2013-12-20 LAB — HEMOGLOBIN A1C: Hgb A1c MFr Bld: 7 % — ABNORMAL HIGH (ref 4.6–6.5)

## 2013-12-20 MED ORDER — AMLODIPINE BESYLATE 10 MG PO TABS: ORAL_TABLET | ORAL | Status: DC

## 2013-12-20 NOTE — Progress Notes (Signed)
Pre visit review using our clinic review tool, if applicable. No additional management support is needed unless otherwise documented below in the visit note. 

## 2013-12-20 NOTE — Assessment & Plan Note (Signed)
stable overall by history and exam, recent data reviewed with pt, and pt to continue medical treatment as before,  to f/u any worsening symptoms or concerns Lab Results  Component Value Date   LDLCALC 44 06/14/2013

## 2013-12-20 NOTE — Assessment & Plan Note (Signed)
Mild uncontrolled, to incr the amlod to 10 mg per day, cont all other meds, low salt diet, wt control

## 2013-12-20 NOTE — Progress Notes (Signed)
Subjective:    Patient ID: Alexandra Gross, female    DOB: 07/05/1928, 80 y.o.   MRN: [id number]  HPI     Here to f/u; overall doing ok,  Pt denies chest pain, increased sob or doe, wheezing, orthopnea, PND, increased LE swelling, palpitations, dizziness or syncope.  Pt denies polydipsia, polyuria, or low sugar symptoms such as weakness or confusion improved with po intake.  Pt denies new neurological symptoms such as new headache, or facial or extremity weakness or numbness.   Pt states overall good compliance with meds, has been trying to follow lower cholesterol DM diet, with wt overall stable,  but little exercise however.  Mixup at the pharmacy it seems so only taking 5 mg amlod - not the 10 mg.  Trying to lose wt -  Wt Readings from Last 3 Encounters:  12/20/13 200 lb (90.719 kg)  06/22/13 205 lb (92.987 kg)  05/11/13 207 lb (93.895 kg)   Past Medical History  Diagnosis Date  . Hypertension   . Diabetes mellitus   . Obesity   . Arthritis   . Hyperlipidemia 08/18/2011  . B12 deficiency 08/18/2011   Past Surgical History  Procedure Laterality Date  . Appendectomy    . Tumor removed      Abdomen (benign)  . Back surgery    . Abdominal hysterectomy  1972    reports that she quit smoking about 20 years ago. Her smoking use included Cigarettes. She smoked 0.00 packs per day. She does not have any smokeless tobacco history on file. She reports that she does not drink alcohol or use illicit drugs. family history includes Arthritis in her mother; Diabetes in her mother; Hypertension in her mother. Allergies  Allergen Reactions  . Lisinopril Other (See Comments)    unknown  . Penicillins Other (See Comments)    unknown   Current Outpatient Prescriptions on File Prior to Visit  Medication Sig Dispense Refill  . amLODipine (NORVASC) 10 MG tablet TAKE 1 TABLET BY MOUTH EVERY DAY  90 tablet  0  . amLODipine (NORVASC) 5 MG tablet Take 1 tablet (5 mg total) by mouth daily.  30 tablet  11  .  aspirin 81 MG tablet Take 81 mg by mouth daily.      Marland Kitchen atorvastatin (LIPITOR) 20 MG tablet TAKE ONE (1) TABLET EACH DAY  90 tablet  1  . COD LIVER OIL PO Take 1 capsule by mouth daily.        . hydrochlorothiazide (HYDRODIURIL) 25 MG tablet Take 1 tablet (25 mg total) by mouth daily. As needed for swelling  90 tablet  3  . metFORMIN (GLUCOPHAGE) 500 MG tablet TAKE ONE TABLET TWICE DAILY  60 tablet  5  . metroNIDAZOLE (FLAGYL) 250 MG tablet Take 1 tablet (250 mg total) by mouth 3 (three) times daily.  30 tablet  0  . valsartan (DIOVAN) 80 MG tablet TAKE 1 TABLET BY MOUTH EVERY DAY  30 tablet  11  . vitamin B-12 (CYANOCOBALAMIN) 500 MCG tablet Take 500 mcg by mouth daily.      Marland Kitchen VITAMIN E PO Take 1 capsule by mouth daily.        . nitroGLYCERIN (NITROSTAT) 0.4 MG SL tablet Place 1 tablet (0.4 mg total) under the tongue every 5 (five) minutes as needed for chest pain.  25 tablet  11   No current facility-administered medications on file prior to visit.   Review of Systems  Constitutional: Negative for unusual diaphoresis  or other sweats  HENT: Negative for ringing in ear Eyes: Negative for double vision or worsening visual disturbance.  Respiratory: Negative for choking and stridor.   Gastrointestinal: Negative for vomiting or other signifcant bowel change Genitourinary: Negative for hematuria or decreased urine volume.  Musculoskeletal: Negative for other MSK pain or swelling Skin: Negative for color change and worsening wound.  Neurological: Negative for tremors and numbness other than noted  Psychiatric/Behavioral: Negative for decreased concentration or agitation other than above       Objective:   Physical Exam BP 152/72  Pulse 88  Temp(Src) 98.7 F (37.1 C) (Oral)  Wt 200 lb (90.719 kg)  SpO2 98% VS noted,  Constitutional: Pt appears well-developed, well-nourished.  HENT: Head: NCAT.  Right Ear: External ear normal.  Left Ear: External ear normal.  Eyes: . Pupils are equal,  round, and reactive to light. Conjunctivae and EOM are normal Neck: Normal range of motion. Neck supple.  Cardiovascular: Normal rate and regular rhythm.   Pulmonary/Chest: Effort normal and breath sounds normal.  Abd:  Soft, NT, ND, + BS Neurological: Pt is alert. Not confused , motor grossly intact Skin: Skin is warm. No rash Psychiatric: Pt behavior is normal. No agitation.     Assessment & Plan:

## 2013-12-20 NOTE — Assessment & Plan Note (Signed)
stable overall by history and exam, recent data reviewed with pt, and pt to continue medical treatment as before,  to f/u any worsening symptoms or concerns Lab Results  Component Value Date   HGBA1C 6.9* 06/14/2013   For f/u lab today

## 2013-12-20 NOTE — Patient Instructions (Addendum)
You had the new Prevnar pneumonia shot today  Please continue all other medications as before, and refills have been done if requested.  Please have the pharmacy call with any other refills you may need.  Please continue your efforts at being more active, low cholesterol diet, and weight control.  You are otherwise up to date with prevention measures today.  Please keep your appointments with your specialists as you may have planned  Please go to the LAB in the Basement (turn left off the elevator) for the tests to be done today  You will be contacted by phone if any changes need to be made immediately.  Otherwise, you will receive a letter about your results with an explanation, but please check with MyChart first.  Please remember to sign up for MyChart if you have not done so, as this will be important to you in the future with finding out test results, communicating by private email, and scheduling acute appointments online when needed.  Please return in 6 months, or sooner if needed, with Lab testing done 3-5 days before  

## 2013-12-20 NOTE — Addendum Note (Signed)
Addended by: Sharon Seller B on: 12/20/2013 02:43 PM   Modules accepted: Orders

## 2013-12-22 ENCOUNTER — Ambulatory Visit: Payer: Medicare Other | Admitting: Internal Medicine

## 2014-02-01 ENCOUNTER — Encounter (HOSPITAL_COMMUNITY): Payer: Self-pay | Admitting: Cardiology

## 2014-03-23 DIAGNOSIS — E119 Type 2 diabetes mellitus without complications: Secondary | ICD-10-CM | POA: Diagnosis not present

## 2014-03-26 DIAGNOSIS — E119 Type 2 diabetes mellitus without complications: Secondary | ICD-10-CM | POA: Diagnosis not present

## 2014-05-15 ENCOUNTER — Other Ambulatory Visit: Payer: Self-pay | Admitting: Internal Medicine

## 2014-06-15 ENCOUNTER — Other Ambulatory Visit: Payer: Self-pay | Admitting: Internal Medicine

## 2014-06-19 ENCOUNTER — Other Ambulatory Visit (INDEPENDENT_AMBULATORY_CARE_PROVIDER_SITE_OTHER): Payer: Medicare Other

## 2014-06-19 DIAGNOSIS — E785 Hyperlipidemia, unspecified: Secondary | ICD-10-CM

## 2014-06-19 DIAGNOSIS — I10 Essential (primary) hypertension: Secondary | ICD-10-CM | POA: Diagnosis not present

## 2014-06-19 DIAGNOSIS — Z0000 Encounter for general adult medical examination without abnormal findings: Secondary | ICD-10-CM

## 2014-06-19 DIAGNOSIS — E119 Type 2 diabetes mellitus without complications: Secondary | ICD-10-CM | POA: Diagnosis not present

## 2014-06-19 DIAGNOSIS — Z0189 Encounter for other specified special examinations: Secondary | ICD-10-CM

## 2014-06-19 LAB — URINALYSIS, ROUTINE W REFLEX MICROSCOPIC
BILIRUBIN URINE: NEGATIVE
Ketones, ur: NEGATIVE
Leukocytes, UA: NEGATIVE
Nitrite: NEGATIVE
PH: 5.5 (ref 5.0–8.0)
SPECIFIC GRAVITY, URINE: 1.02 (ref 1.000–1.030)
URINE GLUCOSE: NEGATIVE
Urobilinogen, UA: 0.2 (ref 0.0–1.0)

## 2014-06-19 LAB — BASIC METABOLIC PANEL
BUN: 26 mg/dL — AB (ref 6–23)
CHLORIDE: 106 meq/L (ref 96–112)
CO2: 24 mEq/L (ref 19–32)
Calcium: 9.1 mg/dL (ref 8.4–10.5)
Creatinine, Ser: 1.41 mg/dL — ABNORMAL HIGH (ref 0.40–1.20)
GFR: 45.7 mL/min — ABNORMAL LOW (ref >60.00)
GLUCOSE: 207 mg/dL — AB (ref 70–99)
Potassium: 4.5 mEq/L (ref 3.5–5.1)
Sodium: 136 mEq/L (ref 135–145)

## 2014-06-19 LAB — CBC WITH DIFFERENTIAL/PLATELET
BASOS ABS: 0 10*3/uL (ref 0.0–0.1)
BASOS PCT: 0.5 % (ref 0.0–3.0)
EOS ABS: 0.2 10*3/uL (ref 0.0–0.7)
Eosinophils Relative: 1.6 % (ref 0.0–5.0)
HCT: 35.2 % — ABNORMAL LOW (ref 36.0–46.0)
HEMOGLOBIN: 11.8 g/dL — AB (ref 12.0–15.0)
Lymphocytes Relative: 45.8 % (ref 12.0–46.0)
Lymphs Abs: 4.5 10*3/uL — ABNORMAL HIGH (ref 0.7–4.0)
MCHC: 33.5 g/dL (ref 30.0–36.0)
MCV: 85 fl (ref 78.0–100.0)
Monocytes Absolute: 0.6 10*3/uL (ref 0.1–1.0)
Monocytes Relative: 5.8 % (ref 3.0–12.0)
NEUTROS ABS: 4.5 10*3/uL (ref 1.4–7.7)
Neutrophils Relative %: 46.3 % (ref 43.0–77.0)
Platelets: 261 10*3/uL (ref 150.0–400.0)
RBC: 4.14 Mil/uL (ref 3.87–5.11)
RDW: 13.8 % (ref 11.5–15.5)
WBC: 9.8 10*3/uL (ref 4.0–10.5)

## 2014-06-19 LAB — LIPID PANEL
CHOL/HDL RATIO: 3
Cholesterol: 123 mg/dL (ref 0–200)
HDL: 44.9 mg/dL (ref >39.00)
LDL Cholesterol: 57 mg/dL (ref 0–99)
NONHDL: 78.1
Triglycerides: 106 mg/dL (ref 0.0–149.0)
VLDL: 21.2 mg/dL (ref 0.0–40.0)

## 2014-06-19 LAB — HEPATIC FUNCTION PANEL
ALBUMIN: 4 g/dL (ref 3.5–5.2)
ALK PHOS: 71 U/L (ref 39–117)
ALT: 28 U/L (ref 0–35)
AST: 26 U/L (ref 0–37)
BILIRUBIN DIRECT: 0.1 mg/dL (ref 0.0–0.3)
BILIRUBIN TOTAL: 0.5 mg/dL (ref 0.2–1.2)
TOTAL PROTEIN: 7.5 g/dL (ref 6.0–8.3)

## 2014-06-19 LAB — MICROALBUMIN / CREATININE URINE RATIO
Creatinine,U: 114.4 mg/dL
Microalb Creat Ratio: 11.1 mg/g (ref 0.0–30.0)
Microalb, Ur: 12.7 mg/dL — ABNORMAL HIGH (ref 0.0–1.9)

## 2014-06-19 LAB — TSH: TSH: 2.04 u[IU]/mL (ref 0.35–4.50)

## 2014-06-19 LAB — HEMOGLOBIN A1C: Hgb A1c MFr Bld: 7.1 % — ABNORMAL HIGH (ref 4.6–6.5)

## 2014-06-21 ENCOUNTER — Ambulatory Visit (INDEPENDENT_AMBULATORY_CARE_PROVIDER_SITE_OTHER): Payer: Medicare Other | Admitting: Internal Medicine

## 2014-06-21 ENCOUNTER — Encounter: Payer: Self-pay | Admitting: Internal Medicine

## 2014-06-21 VITALS — BP 126/78 | HR 77 | Temp 98.3°F | Resp 18 | Ht 63.0 in | Wt 200.0 lb

## 2014-06-21 DIAGNOSIS — E119 Type 2 diabetes mellitus without complications: Secondary | ICD-10-CM

## 2014-06-21 DIAGNOSIS — Z0000 Encounter for general adult medical examination without abnormal findings: Secondary | ICD-10-CM | POA: Diagnosis not present

## 2014-06-21 NOTE — Assessment & Plan Note (Signed)

## 2014-06-21 NOTE — Progress Notes (Signed)
Pre visit review using our clinic review tool, if applicable. No additional management support is needed unless otherwise documented below in the visit note. 

## 2014-06-21 NOTE — Patient Instructions (Signed)
Please continue all other medications as before, and refills have been done if requested.  Please have the pharmacy call with any other refills you may need.  Please continue your efforts at being more active, low cholesterol diet, and weight control.  You are otherwise up to date with prevention measures today.  Please keep your appointments with your specialists as you may have planned  Please return in 6 months, or sooner if needed, with Lab testing done 3-5 days before  

## 2014-06-21 NOTE — Progress Notes (Signed)
Subjective:    Patient ID: Alexandra Gross, female    DOB: 07/05/1928, 81 y.o.   MRN: [id number]  HPI  Here for wellness and f/u;  Overall doing ok;  Pt denies Chest pain, worsening SOB, DOE, wheezing, orthopnea, PND, worsening LE edema, palpitations, dizziness or syncope.  Pt denies neurological change such as new headache, facial or extremity weakness.  Pt denies polydipsia, polyuria, or low sugar symptoms. Pt states overall good compliance with treatment and medications, good tolerability, and has been trying to follow appropriate diet.  Pt denies worsening depressive symptoms, suicidal ideation or panic. No fever, night sweats, wt loss, loss of appetite, or other constitutional symptoms.  Pt states good ability with ADL's, has low fall risk, home safety reviewed and adequate, no other significant changes in hearing or vision, and only occasionally active with exercise. No current complaints Past Medical History  Diagnosis Date  . Hypertension   . Diabetes mellitus   . Obesity   . Arthritis   . Hyperlipidemia 08/18/2011  . B12 deficiency 08/18/2011   Past Surgical History  Procedure Laterality Date  . Appendectomy    . Tumor removed      Abdomen (benign)  . Back surgery    . Abdominal hysterectomy  1972  . Left heart catheterization with coronary angiogram N/A 01/06/2011    Procedure: LEFT HEART CATHETERIZATION WITH CORONARY ANGIOGRAM;  Surgeon: Minus Breeding, MD;  Location: [facility] CATH LAB;  Service: Cardiovascular;  Laterality: N/A;    reports that she quit smoking about 21 years ago. Her smoking use included Cigarettes. She does not have any smokeless tobacco history on file. She reports that she does not drink alcohol or use illicit drugs. family history includes Arthritis in her mother; Diabetes in her mother; Hypertension in her mother. Allergies  Allergen Reactions  . Lisinopril Other (See Comments)    unknown  . Penicillins Other (See Comments)    unknown   Current Outpatient  Prescriptions on File Prior to Visit  Medication Sig Dispense Refill  . amLODipine (NORVASC) 10 MG tablet TAKE 1 TABLET BY MOUTH EVERY DAY 90 tablet 3  . aspirin 81 MG tablet Take 81 mg by mouth daily.    Marland Kitchen atorvastatin (LIPITOR) 20 MG tablet TAKE 1 TABLET BY MOUTH EVERY DAY 90 tablet 1  . COD LIVER OIL PO Take 1 capsule by mouth daily.      . hydrochlorothiazide (HYDRODIURIL) 25 MG tablet Take 1 tablet (25 mg total) by mouth daily. As needed for swelling 90 tablet 3  . metFORMIN (GLUCOPHAGE) 500 MG tablet TAKE ONE TABLET TWICE DAILY 60 tablet 5  . metroNIDAZOLE (FLAGYL) 250 MG tablet Take 1 tablet (250 mg total) by mouth 3 (three) times daily. 30 tablet 0  . valsartan (DIOVAN) 80 MG tablet TAKE 1 TABLET BY MOUTH EVERY DAY 30 tablet 11  . vitamin B-12 (CYANOCOBALAMIN) 500 MCG tablet Take 500 mcg by mouth daily.    Marland Kitchen VITAMIN E PO Take 1 capsule by mouth daily.      . nitroGLYCERIN (NITROSTAT) 0.4 MG SL tablet Place 1 tablet (0.4 mg total) under the tongue every 5 (five) minutes as needed for chest pain. 25 tablet 11   No current facility-administered medications on file prior to visit.    Review of Systems Constitutional: Negative for increased diaphoresis, other activity, appetite or siginficant weight change other than noted HENT: Negative for worsening hearing loss, ear pain, facial swelling, mouth sores and neck stiffness.   Eyes: Negative  for other worsening pain, redness or visual disturbance.  Respiratory: Negative for shortness of breath and wheezing  Cardiovascular: Negative for chest pain and palpitations.  Gastrointestinal: Negative for diarrhea, blood in stool, abdominal distention or other pain Genitourinary: Negative for hematuria, flank pain or change in urine volume.  Musculoskeletal: Negative for myalgias or other joint complaints.  Skin: Negative for color change and wound or drainage.  Neurological: Negative for syncope and numbness. other than noted Hematological:  Negative for adenopathy. or other swelling Psychiatric/Behavioral: Negative for hallucinations, SI, self-injury, decreased concentration or other worsening agitation.      Objective:   Physical Exam BP 126/78 mmHg  Pulse 77  Temp(Src) 98.3 F (36.8 C) (Oral)  Resp 18  Ht 5\' 3"  (1.6 m)  Wt 200 lb (90.719 kg)  BMI 35.44 kg/m2  SpO2 97% VS noted,  Constitutional: Pt is oriented to person, place, and time. Appears well-developed and well-nourished, in no significant distress Head: Normocephalic and atraumatic.  Right Ear: External ear normal.  Left Ear: External ear normal.  Nose: Nose normal.  Mouth/Throat: Oropharynx is clear and moist.  Eyes: Conjunctivae and EOM are normal. Pupils are equal, round, and reactive to light.  Neck: Normal range of motion. Neck supple. No JVD present. No tracheal deviation present or significant neck LA or mass Cardiovascular: Normal rate, regular rhythm, normal heart sounds and intact distal pulses.   Pulmonary/Chest: Effort normal and breath sounds without rales or wheezing  Abdominal: Soft. Bowel sounds are normal. NT. No HSM  Musculoskeletal: Normal range of motion. Exhibits no edema.  Lymphadenopathy:  Has no cervical adenopathy.  Neurological: Pt is alert and oriented to person, place, and time. Pt has normal reflexes. No cranial nerve deficit. Motor grossly intact Skin: Skin is warm and dry. No rash noted.  Psychiatric:  Has normal mood and affect. Behavior is normal.     Assessment & Plan:

## 2014-06-21 NOTE — Assessment & Plan Note (Signed)
stable overall by history and exam, recent data reviewed with pt, and pt to continue medical treatment as before,  to f/u any worsening symptoms or concerns Lab Results  Component Value Date   HGBA1C 7.1* 06/19/2014

## 2014-06-22 ENCOUNTER — Telehealth: Payer: Self-pay | Admitting: Internal Medicine

## 2014-07-25 DIAGNOSIS — E119 Type 2 diabetes mellitus without complications: Secondary | ICD-10-CM | POA: Diagnosis not present

## 2014-08-14 DIAGNOSIS — E119 Type 2 diabetes mellitus without complications: Secondary | ICD-10-CM | POA: Diagnosis not present

## 2014-08-14 DIAGNOSIS — H25013 Cortical age-related cataract, bilateral: Secondary | ICD-10-CM | POA: Diagnosis not present

## 2014-08-14 LAB — HM DIABETES EYE EXAM

## 2014-09-21 ENCOUNTER — Encounter: Payer: Self-pay | Admitting: Internal Medicine

## 2014-11-23 ENCOUNTER — Other Ambulatory Visit: Payer: Self-pay | Admitting: Internal Medicine

## 2014-12-11 DIAGNOSIS — E119 Type 2 diabetes mellitus without complications: Secondary | ICD-10-CM | POA: Diagnosis not present

## 2014-12-14 ENCOUNTER — Other Ambulatory Visit: Payer: Self-pay | Admitting: Internal Medicine

## 2014-12-19 ENCOUNTER — Other Ambulatory Visit (INDEPENDENT_AMBULATORY_CARE_PROVIDER_SITE_OTHER): Payer: Medicare Other

## 2014-12-19 DIAGNOSIS — E119 Type 2 diabetes mellitus without complications: Secondary | ICD-10-CM | POA: Diagnosis not present

## 2014-12-19 LAB — HEPATIC FUNCTION PANEL
ALBUMIN: 3.8 g/dL (ref 3.5–5.2)
ALT: 25 U/L (ref 0–35)
AST: 25 U/L (ref 0–37)
Alkaline Phosphatase: 64 U/L (ref 39–117)
BILIRUBIN DIRECT: 0.1 mg/dL (ref 0.0–0.3)
TOTAL PROTEIN: 7.4 g/dL (ref 6.0–8.3)
Total Bilirubin: 0.4 mg/dL (ref 0.2–1.2)

## 2014-12-19 LAB — LIPID PANEL
Cholesterol: 138 mg/dL (ref 0–200)
HDL: 42.3 mg/dL (ref >39.00)
NonHDL: 96.12
Total CHOL/HDL Ratio: 3
Triglycerides: 224 mg/dL — ABNORMAL HIGH (ref 0.0–149.0)
VLDL: 44.8 mg/dL — AB (ref 0.0–40.0)

## 2014-12-19 LAB — BASIC METABOLIC PANEL
BUN: 31 mg/dL — ABNORMAL HIGH (ref 6–23)
CALCIUM: 9.3 mg/dL (ref 8.4–10.5)
CO2: 25 mEq/L (ref 19–32)
Chloride: 109 mEq/L (ref 96–112)
Creatinine, Ser: 1.47 mg/dL — ABNORMAL HIGH (ref 0.40–1.20)
GFR: 43.5 mL/min — AB (ref >60.00)
Glucose, Bld: 176 mg/dL — ABNORMAL HIGH (ref 70–99)
Potassium: 4.4 mEq/L (ref 3.5–5.1)
SODIUM: 142 meq/L (ref 135–145)

## 2014-12-19 LAB — LDL CHOLESTEROL, DIRECT: Direct LDL: 66 mg/dL

## 2014-12-19 LAB — HEMOGLOBIN A1C: Hgb A1c MFr Bld: 7.1 % — ABNORMAL HIGH (ref 4.6–6.5)

## 2014-12-21 ENCOUNTER — Encounter: Payer: Self-pay | Admitting: Internal Medicine

## 2014-12-21 ENCOUNTER — Ambulatory Visit (INDEPENDENT_AMBULATORY_CARE_PROVIDER_SITE_OTHER): Payer: Medicare Other | Admitting: Internal Medicine

## 2014-12-21 VITALS — BP 144/64 | HR 76 | Temp 98.1°F | Ht 63.0 in | Wt 201.0 lb

## 2014-12-21 DIAGNOSIS — Z0189 Encounter for other specified special examinations: Secondary | ICD-10-CM

## 2014-12-21 DIAGNOSIS — I10 Essential (primary) hypertension: Secondary | ICD-10-CM

## 2014-12-21 DIAGNOSIS — E785 Hyperlipidemia, unspecified: Secondary | ICD-10-CM | POA: Diagnosis not present

## 2014-12-21 DIAGNOSIS — R609 Edema, unspecified: Secondary | ICD-10-CM | POA: Diagnosis not present

## 2014-12-21 DIAGNOSIS — E119 Type 2 diabetes mellitus without complications: Secondary | ICD-10-CM

## 2014-12-21 DIAGNOSIS — Z0000 Encounter for general adult medical examination without abnormal findings: Secondary | ICD-10-CM

## 2014-12-21 DIAGNOSIS — N183 Chronic kidney disease, stage 3 (moderate): Secondary | ICD-10-CM

## 2014-12-21 MED ORDER — HYDROCHLOROTHIAZIDE 25 MG PO TABS: 25.0000 mg | ORAL_TABLET | Freq: Every day | ORAL | Status: DC

## 2014-12-21 NOTE — Assessment & Plan Note (Addendum)
ECG reviewed as per emr, likely related to stopping HCT, for restart, also echo r/o chf

## 2014-12-21 NOTE — Patient Instructions (Addendum)
Your EKG was OK today  Please continue all other medications as before, including re-starting the HCT fluid pill  Please have the pharmacy call with any other refills you may need.  Please continue your efforts at being more active, low cholesterol diet, and weight control.  You will be contacted regarding the referral for: Echocardiogram to check the heart function  Please keep your appointments with your specialists as you may have planned  Please return in 6 months, or sooner if needed, with Lab testing done 3-5 days before

## 2014-12-21 NOTE — Progress Notes (Signed)
Subjective:    Patient ID: Alexandra Gross, female    DOB: 07/05/1928, 81 y.o.   MRN: [id number]  HPI  Here to f/u; overall doing ok,  Pt denies chest pain, increasing sob or doe, wheezing, orthopnea, PND, palpitations, dizziness or syncope, but has had worsening Leg edema.x 1 mo..  Pt denies new neurological symptoms such as new headache, or facial or extremity weakness or numbness.  Pt denies polydipsia, polyuria, or low sugar episode.   Pt denies new neurological symptoms such as new headache, or facial or extremity weakness or numbness.   Pt states overall good compliance with meds, mostly trying to follow appropriate diet, with wt overall stable,  but little exercise however. For some reason became confused about meds, accidentally stopped her HCT. IS taking the metformin, despite the med notation by staff today (pt was confused on first arrival). Past Medical History  Diagnosis Date  . Hypertension   . Diabetes mellitus   . Obesity   . Arthritis   . Hyperlipidemia 08/18/2011  . B12 deficiency 08/18/2011   Past Surgical History  Procedure Laterality Date  . Appendectomy    . Tumor removed      Abdomen (benign)  . Back surgery    . Abdominal hysterectomy  1972  . Left heart catheterization with coronary angiogram N/A 01/06/2011    Procedure: LEFT HEART CATHETERIZATION WITH CORONARY ANGIOGRAM;  Surgeon: Minus Breeding, MD;  Location: [facility] CATH LAB;  Service: Cardiovascular;  Laterality: N/A;    reports that she quit smoking about 21 years ago. Her smoking use included Cigarettes. She does not have any smokeless tobacco history on file. She reports that she does not drink alcohol or use illicit drugs. family history includes Arthritis in her mother; Diabetes in her mother; Hypertension in her mother. Allergies  Allergen Reactions  . Lisinopril Other (See Comments)    unknown  . Penicillins Other (See Comments)    unknown   Current Outpatient Prescriptions on File Prior to Visit    Medication Sig Dispense Refill  . amLODipine (NORVASC) 10 MG tablet TAKE 1 TABLET BY MOUTH EVERY DAY 90 tablet 3  . aspirin 81 MG tablet Take 81 mg by mouth daily.    Marland Kitchen atorvastatin (LIPITOR) 20 MG tablet TAKE 1 TABLET BY MOUTH EVERY DAY 90 tablet 1  . COD LIVER OIL PO Take 1 capsule by mouth daily.      . metFORMIN (GLUCOPHAGE) 500 MG tablet TAKE ONE TABLET TWICE DAILY 60 tablet 5  . valsartan (DIOVAN) 80 MG tablet TAKE 1 TABLET BY MOUTH EVERY DAY 90 tablet 1  . hydrochlorothiazide (HYDRODIURIL) 25 MG tablet Take 1 tablet (25 mg total) by mouth daily. As needed for swelling (Patient not taking: Reported on 12/21/2014) 90 tablet 3  . nitroGLYCERIN (NITROSTAT) 0.4 MG SL tablet Place 1 tablet (0.4 mg total) under the tongue every 5 (five) minutes as needed for chest pain. 25 tablet 11  . vitamin B-12 (CYANOCOBALAMIN) 500 MCG tablet Take 500 mcg by mouth daily.    Marland Kitchen VITAMIN E PO Take 1 capsule by mouth daily.       No current facility-administered medications on file prior to visit.   Review of Systems  Constitutional: Negative for unusual diaphoresis or night sweats HENT: Negative for ringing in ear or discharge Eyes: Negative for double vision or worsening visual disturbance.  Respiratory: Negative for choking and stridor.   Gastrointestinal: Negative for vomiting or other signifcant bowel change Genitourinary: Negative for hematuria  or change in urine volume.  Musculoskeletal: Negative for other MSK pain or swelling Skin: Negative for color change and worsening wound.  Neurological: Negative for tremors and numbness other than noted  Psychiatric/Behavioral: Negative for decreased concentration or agitation other than above       Objective:   Physical Exam BP 144/64 mmHg  Pulse 76  Temp(Src) 98.1 F (36.7 C) (Oral)  Ht 5\' 3"  (1.6 m)  Wt 201 lb (91.173 kg)  BMI 35.61 kg/m2  SpO2 98% VS noted, not ill appearing Constitutional: Pt appears in no significant distress HENT: Head:  NCAT.  Right Ear: External ear normal.  Left Ear: External ear normal.  Eyes: . Pupils are equal, round, and reactive to light. Conjunctivae and EOM are normal Neck: Normal range of motion. Neck supple.  Cardiovascular: Normal rate and regular rhythm.   Pulmonary/Chest: Effort normal and breath sounds without rales or wheezing.  Abd:  Soft, NT, ND, + BS Neurological: Pt is alert. Not confused , motor grossly intact Skin: Skin is warm. No rash, 1+ bilat LE edema to mid legs Psychiatric: Pt behavior is normal. No agitation.     Assessment & Plan:

## 2014-12-21 NOTE — Progress Notes (Signed)
Pre visit review using our clinic review tool, if applicable. No additional management support is needed unless otherwise documented below in the visit note. 

## 2014-12-22 NOTE — Assessment & Plan Note (Signed)
stable overall by history and exam, recent data reviewed with pt, and pt to continue medical treatment as before,  to f/u any worsening symptoms or concerns Lab Results  Component Value Date   HGBA1C 7.1* 12/19/2014

## 2014-12-22 NOTE — Assessment & Plan Note (Signed)
stable overall by history and exam, recent data reviewed with pt, and pt to continue medical treatment as before,  to f/u any worsening symptoms or concerns BP Readings from Last 3 Encounters:  12/21/14 144/64  06/21/14 126/78  12/20/13 152/72

## 2014-12-22 NOTE — Assessment & Plan Note (Signed)
stable overall by history and exam, recent data reviewed with pt, and pt to continue medical treatment as before,  to f/u any worsening symptoms or concerns Lab Results  Component Value Date   LDLCALC 57 06/19/2014

## 2014-12-22 NOTE — Assessment & Plan Note (Signed)
stable overall by history and exam, recent data reviewed with pt, and pt to continue medical treatment as before,  to f/u any worsening symptoms or concerns Lab Results  Component Value Date   CREATININE 1.47* 12/19/2014

## 2014-12-29 ENCOUNTER — Other Ambulatory Visit: Payer: Self-pay | Admitting: Internal Medicine

## 2014-12-30 ENCOUNTER — Emergency Department (HOSPITAL_COMMUNITY): Payer: Medicare Other

## 2014-12-30 ENCOUNTER — Encounter (HOSPITAL_COMMUNITY): Payer: Self-pay | Admitting: Emergency Medicine

## 2014-12-30 ENCOUNTER — Inpatient Hospital Stay (HOSPITAL_COMMUNITY)
Admission: EM | Admit: 2014-12-30 | Discharge: 2015-01-01 | DRG: 282 | Disposition: A | Discharge status: Home / Self Care | Payer: Medicare Other | Attending: Cardiology | Admitting: Cardiology

## 2014-12-30 DIAGNOSIS — I2510 Atherosclerotic heart disease of native coronary artery without angina pectoris: Secondary | ICD-10-CM | POA: Diagnosis present

## 2014-12-30 DIAGNOSIS — R609 Edema, unspecified: Secondary | ICD-10-CM | POA: Diagnosis not present

## 2014-12-30 DIAGNOSIS — Z87891 Personal history of nicotine dependence: Secondary | ICD-10-CM

## 2014-12-30 DIAGNOSIS — E538 Deficiency of other specified B group vitamins: Secondary | ICD-10-CM | POA: Diagnosis present

## 2014-12-30 DIAGNOSIS — Z8249 Family history of ischemic heart disease and other diseases of the circulatory system: Secondary | ICD-10-CM

## 2014-12-30 DIAGNOSIS — M1990 Unspecified osteoarthritis, unspecified site: Secondary | ICD-10-CM | POA: Diagnosis not present

## 2014-12-30 DIAGNOSIS — N183 Chronic kidney disease, stage 3 unspecified: Secondary | ICD-10-CM

## 2014-12-30 DIAGNOSIS — E785 Hyperlipidemia, unspecified: Secondary | ICD-10-CM | POA: Diagnosis not present

## 2014-12-30 DIAGNOSIS — I200 Unstable angina: Secondary | ICD-10-CM | POA: Diagnosis not present

## 2014-12-30 DIAGNOSIS — R740 Nonspecific elevation of levels of transaminase and lactic acid dehydrogenase [LDH]: Secondary | ICD-10-CM | POA: Diagnosis not present

## 2014-12-30 DIAGNOSIS — I129 Hypertensive chronic kidney disease with stage 1 through stage 4 chronic kidney disease, or unspecified chronic kidney disease: Secondary | ICD-10-CM | POA: Diagnosis present

## 2014-12-30 DIAGNOSIS — E669 Obesity, unspecified: Secondary | ICD-10-CM | POA: Diagnosis present

## 2014-12-30 DIAGNOSIS — R7989 Other specified abnormal findings of blood chemistry: Secondary | ICD-10-CM

## 2014-12-30 DIAGNOSIS — Z833 Family history of diabetes mellitus: Secondary | ICD-10-CM | POA: Diagnosis not present

## 2014-12-30 DIAGNOSIS — I10 Essential (primary) hypertension: Secondary | ICD-10-CM | POA: Diagnosis present

## 2014-12-30 DIAGNOSIS — E1122 Type 2 diabetes mellitus with diabetic chronic kidney disease: Secondary | ICD-10-CM | POA: Diagnosis not present

## 2014-12-30 DIAGNOSIS — R0789 Other chest pain: Secondary | ICD-10-CM | POA: Diagnosis not present

## 2014-12-30 DIAGNOSIS — Z79899 Other long term (current) drug therapy: Secondary | ICD-10-CM

## 2014-12-30 DIAGNOSIS — Z7982 Long term (current) use of aspirin: Secondary | ICD-10-CM | POA: Diagnosis not present

## 2014-12-30 DIAGNOSIS — I214 Non-ST elevation (NSTEMI) myocardial infarction: Principal | ICD-10-CM | POA: Diagnosis present

## 2014-12-30 DIAGNOSIS — R079 Chest pain, unspecified: Secondary | ICD-10-CM | POA: Diagnosis not present

## 2014-12-30 DIAGNOSIS — Z7984 Long term (current) use of oral hypoglycemic drugs: Secondary | ICD-10-CM | POA: Diagnosis not present

## 2014-12-30 DIAGNOSIS — I25110 Atherosclerotic heart disease of native coronary artery with unstable angina pectoris: Secondary | ICD-10-CM | POA: Diagnosis not present

## 2014-12-30 DIAGNOSIS — R778 Other specified abnormalities of plasma proteins: Secondary | ICD-10-CM

## 2014-12-30 DIAGNOSIS — J811 Chronic pulmonary edema: Secondary | ICD-10-CM | POA: Diagnosis not present

## 2014-12-30 DIAGNOSIS — Z90710 Acquired absence of both cervix and uterus: Secondary | ICD-10-CM | POA: Diagnosis not present

## 2014-12-30 HISTORY — DX: Atherosclerotic heart disease of native coronary artery without angina pectoris: I25.10

## 2014-12-30 LAB — COMPREHENSIVE METABOLIC PANEL
ALK PHOS: 67 U/L (ref 38–126)
ALT: 30 U/L (ref 14–54)
AST: 33 U/L (ref 15–41)
Albumin: 3.6 g/dL (ref 3.5–5.0)
Anion gap: 9 (ref 5–15)
BILIRUBIN TOTAL: 0.8 mg/dL (ref 0.3–1.2)
BUN: 23 mg/dL — ABNORMAL HIGH (ref 6–20)
CALCIUM: 9.3 mg/dL (ref 8.9–10.3)
CO2: 24 mmol/L (ref 22–32)
CREATININE: 1.4 mg/dL — AB (ref 0.44–1.00)
Chloride: 108 mmol/L (ref 101–111)
GFR, EST AFRICAN AMERICAN: 40 mL/min — AB (ref >60)
GFR, EST NON AFRICAN AMERICAN: 34 mL/min — AB (ref >60)
Glucose, Bld: 114 mg/dL — ABNORMAL HIGH (ref 65–99)
Potassium: 4.1 mmol/L (ref 3.5–5.1)
SODIUM: 141 mmol/L (ref 135–145)
TOTAL PROTEIN: 7.9 g/dL (ref 6.5–8.1)

## 2014-12-30 LAB — GLUCOSE, CAPILLARY
GLUCOSE-CAPILLARY: 136 mg/dL — AB (ref 65–99)
Glucose-Capillary: 153 mg/dL — ABNORMAL HIGH (ref 65–99)

## 2014-12-30 LAB — BRAIN NATRIURETIC PEPTIDE
B Natriuretic Peptide: 153.1 pg/mL — ABNORMAL HIGH (ref 0.0–100.0)
B Natriuretic Peptide: 145.8 pg/mL — ABNORMAL HIGH (ref 0.0–100.0)

## 2014-12-30 LAB — CBC
HEMATOCRIT: 34.1 % — AB (ref 36.0–46.0)
Hemoglobin: 11.5 g/dL — ABNORMAL LOW (ref 12.0–15.0)
MCH: 29.1 pg (ref 26.0–34.0)
MCHC: 33.7 g/dL (ref 30.0–36.0)
MCV: 86.3 fL (ref 78.0–100.0)
Platelets: 250 10*3/uL (ref 150–400)
RBC: 3.95 MIL/uL (ref 3.87–5.11)
RDW: 13.9 % (ref 11.5–15.5)
WBC: 9.7 10*3/uL (ref 4.0–10.5)

## 2014-12-30 LAB — BASIC METABOLIC PANEL
Anion gap: 8 (ref 5–15)
BUN: 23 mg/dL — AB (ref 6–20)
CHLORIDE: 108 mmol/L (ref 101–111)
CO2: 24 mmol/L (ref 22–32)
Calcium: 9.4 mg/dL (ref 8.9–10.3)
Creatinine, Ser: 1.34 mg/dL — ABNORMAL HIGH (ref 0.44–1.00)
GFR calc Af Amer: 42 mL/min — ABNORMAL LOW (ref >60)
GFR, EST NON AFRICAN AMERICAN: 36 mL/min — AB (ref >60)
GLUCOSE: 130 mg/dL — AB (ref 65–99)
POTASSIUM: 4.3 mmol/L (ref 3.5–5.1)
Sodium: 140 mmol/L (ref 135–145)

## 2014-12-30 LAB — TROPONIN I: TROPONIN I: 0.07 ng/mL — AB (ref <0.031)

## 2014-12-30 LAB — TSH: TSH: 2.85 u[IU]/mL (ref 0.350–4.500)

## 2014-12-30 LAB — I-STAT TROPONIN, ED: Troponin i, poc: 0.1 ng/mL (ref 0.00–0.08)

## 2014-12-30 MED ORDER — MORPHINE SULFATE (PF) 4 MG/ML IV SOLN
4.0000 mg | Freq: Once | INTRAVENOUS | Status: DC
  Filled 2014-12-30: qty 1

## 2014-12-30 MED ORDER — NITROGLYCERIN 0.4 MG SL SUBL
0.4000 mg | SUBLINGUAL_TABLET | SUBLINGUAL | Status: DC | PRN
  Filled 2014-12-30: qty 1

## 2014-12-30 MED ORDER — INSULIN ASPART 100 UNIT/ML ~~LOC~~ SOLN
0.0000 [IU] | Freq: Three times a day (TID) | SUBCUTANEOUS | Status: DC
  Administered 2014-12-30: 2 [IU] via SUBCUTANEOUS
  Administered 2014-12-31: 3 [IU] via SUBCUTANEOUS
  Administered 2015-01-01: 2 [IU] via SUBCUTANEOUS

## 2014-12-30 MED ORDER — CYANOCOBALAMIN 500 MCG PO TABS
500.0000 ug | ORAL_TABLET | Freq: Every day | ORAL | Status: DC
Start: 2014-12-31 — End: 2015-01-01
  Administered 2014-12-31 – 2015-01-01 (×2): 500 ug via ORAL
  Filled 2014-12-30 (×2): qty 1

## 2014-12-30 MED ORDER — HEPARIN BOLUS VIA INFUSION
4000.0000 [IU] | Freq: Once | INTRAVENOUS | Status: AC
  Administered 2014-12-30: 4000 [IU] via INTRAVENOUS
  Filled 2014-12-30: qty 4000

## 2014-12-30 MED ORDER — NITROGLYCERIN 0.4 MG SL SUBL: 0.4000 mg | SUBLINGUAL_TABLET | SUBLINGUAL | Status: DC | PRN

## 2014-12-30 MED ORDER — ACETAMINOPHEN 325 MG PO TABS: 650.0000 mg | ORAL_TABLET | ORAL | Status: DC | PRN

## 2014-12-30 MED ORDER — HYDROCHLOROTHIAZIDE 25 MG PO TABS: 25.0000 mg | ORAL_TABLET | Freq: Every day | ORAL | Status: DC | PRN

## 2014-12-30 MED ORDER — ASPIRIN 325 MG PO TABS
325.0000 mg | ORAL_TABLET | Freq: Once | ORAL | Status: AC
  Administered 2014-12-30: 325 mg via ORAL
  Filled 2014-12-30: qty 1

## 2014-12-30 MED ORDER — METFORMIN HCL 500 MG PO TABS: 500.0000 mg | ORAL_TABLET | Freq: Two times a day (BID) | ORAL | Status: DC

## 2014-12-30 MED ORDER — IRBESARTAN 75 MG PO TABS
75.0000 mg | ORAL_TABLET | Freq: Every day | ORAL | Status: DC
  Administered 2014-12-30 – 2014-12-31 (×2): 75 mg via ORAL
  Filled 2014-12-30 (×2): qty 1

## 2014-12-30 MED ORDER — ATORVASTATIN CALCIUM 20 MG PO TABS
20.0000 mg | ORAL_TABLET | Freq: Every day | ORAL | Status: DC
  Administered 2014-12-31 – 2015-01-01 (×2): 20 mg via ORAL
  Filled 2014-12-30 (×2): qty 1

## 2014-12-30 MED ORDER — HEPARIN (PORCINE) IN NACL 100-0.45 UNIT/ML-% IJ SOLN
1000.0000 [IU]/h | INTRAMUSCULAR | Status: DC
  Administered 2014-12-30: 850 [IU]/h via INTRAVENOUS
  Filled 2014-12-30: qty 250

## 2014-12-30 MED ORDER — ASPIRIN EC 81 MG PO TBEC
81.0000 mg | DELAYED_RELEASE_TABLET | Freq: Every day | ORAL | Status: DC
  Administered 2014-12-31 – 2015-01-01 (×2): 81 mg via ORAL
  Filled 2014-12-30 (×2): qty 1

## 2014-12-30 MED ORDER — ONDANSETRON HCL 4 MG/2ML IJ SOLN: 4.0000 mg | Freq: Four times a day (QID) | INTRAMUSCULAR | Status: DC | PRN

## 2014-12-30 MED ORDER — AMLODIPINE BESYLATE 10 MG PO TABS
10.0000 mg | ORAL_TABLET | Freq: Every day | ORAL | Status: DC
  Administered 2014-12-31 – 2015-01-01 (×2): 10 mg via ORAL
  Filled 2014-12-30 (×2): qty 1

## 2014-12-30 NOTE — ED Notes (Signed)
Pt. Stated, i have this p[ainin my chesdt all around my left breat since 1100 this morning

## 2014-12-30 NOTE — H&P (Addendum)
History and Physical   Admit date: 12/30/2014 Name:  Alexandra Gross Medical record number: [id number] DOB/Age:  Dec 25, 1931  81 y.o. female  Referring Physician:   [facility]  Primary Physician:   Dr. Cathlean Cower  Chief complaint/reason for admission: Chest pain  HPI:  This 81 year old black female has a history of hypertension and diabetes and hyperlipidemia.  She developed chest discomfort in November 2012 and Dr. Percival Spanish took her straight to catheterization.  At that time he found 50% stenosis in the circumflex and 50% proximal and distal right coronary artery stenosis with normal LV function.  Minimal disease in the LAD.  She was treated medically and has not been seen by him.  She occasionally will have some vague tightness in her chest with activity.  She was at church this morning and while singing developed midsternal pain described as pressure which was severe and lasted for maybe an hour prompting her to leave church.  She went home and the discomfort persisted and she came to the emergency room where it has gradually resolved or is just an ache now.  An EKG shows minor nonspecific changes and a point-of-care troponin is 0.1.  She does have diabetes hypertension and hyperlipidemia.  She currently lives alone and is able to do her housework.  No recent bleeding.At recent physical noted to have developed some edema and an ECHO was ordered.   Past Medical History  Diagnosis Date  . Hypertension   . Diabetes mellitus   . Obesity   . Arthritis   . Hyperlipidemia   . B12 deficiency      Past Surgical History  Procedure Laterality Date  . Appendectomy    . Tumor removed      Abdomen (benign)  . Back surgery    . Abdominal hysterectomy  1972  . Left heart catheterization with coronary angiogram N/A 01/06/2011    Procedure: LEFT HEART CATHETERIZATION WITH CORONARY ANGIOGRAM;  Surgeon: Minus Breeding, MD;  Location: [facility] CATH LAB;  Service: Cardiovascular;  Laterality: N/A;    Allergies: is allergic to lisinopril and penicillins.   Medications: Prior to Admission medications   Medication Sig Start Date End Date Taking? Authorizing Provider  amLODipine (NORVASC) 10 MG tablet TAKE 1 TABLET BY MOUTH EVERY DAY 12/20/13  Yes Biagio Borg, MD  aspirin 81 MG tablet Take 81 mg by mouth daily.   Yes Historical Provider, MD  atorvastatin (LIPITOR) 20 MG tablet TAKE 1 TABLET BY MOUTH EVERY DAY 12/14/14  Yes Biagio Borg, MD  COD LIVER OIL PO Take 1 capsule by mouth daily.     Yes Historical Provider, MD  hydrochlorothiazide (HYDRODIURIL) 25 MG tablet Take 1 tablet (25 mg total) by mouth daily. As needed for swelling 12/21/14  Yes Biagio Borg, MD  metFORMIN (GLUCOPHAGE) 500 MG tablet TAKE ONE TABLET TWICE DAILY 11/23/14  Yes Biagio Borg, MD  valsartan (DIOVAN) 80 MG tablet TAKE 1 TABLET BY MOUTH EVERY DAY 11/23/14  Yes Biagio Borg, MD  vitamin B-12 (CYANOCOBALAMIN) 500 MCG tablet Take 500 mcg by mouth daily.   Yes Historical Provider, MD  VITAMIN E PO Take 1 capsule by mouth daily.     Yes Historical Provider, MD  nitroGLYCERIN (NITROSTAT) 0.4 MG SL tablet Place 1 tablet (0.4 mg total) under the tongue every 5 (five) minutes as needed for chest pain. 01/05/11 01/05/12  Minus Breeding, MD    Family History:  Family Status  Relation Status Death Age  .  Mother Deceased   . Father Deceased     Social History:   reports that she quit smoking about 21 years ago. Her smoking use included Cigarettes. She has never used smokeless tobacco. She reports that she does not drink alcohol or use illicit drugs.   Social History   Social History Narrative   Lives alone.  2 grandchildren.  1 great grand.     Review of Systems:  She has been obese previously.  Has known cataracts.  Mild numbness and tingling in her feet.  Chronic back pain as well as pain involving both of her knees. Other than as noted above, the remainder of the review of systems is normal  Physical Exam: BP  171/55 mmHg  Pulse 62  Temp(Src) 98.1 F (36.7 C)  Resp 17  Wt 91.23 kg (201 lb 2 oz)  SpO2 100%  General appearance: Pleasant obese black female currently in no acute distress Head: Normocephalic, without obvious abnormality, atraumatic Eyes: conjunctivae/corneas clear. PERRL, EOM's intact. Fundi not examined  Neck: no adenopathy, no carotid bruit, no JVD and supple, symmetrical, trachea midline Lungs: clear to auscultation bilaterally Heart: regular rate and rhythm, S1, S2 normal, no murmur, click, rub or gallop Abdomen: soft, non-tender; bowel sounds normal; no masses,  no organomegaly Pelvic: deferred Extremities: Extremities normal atraumatic, 1+ edema Pulses: 2+ and symmetric Skin: Skin color, texture, turgor normal. No rashes or lesions Neurologic: Grossly normal  Labs: CBC  Recent Labs  12/30/14 1445  WBC 9.7  RBC 3.95  HGB 11.5*  HCT 34.1*  PLT 250  MCV 86.3  MCH 29.1  MCHC 33.7  RDW 13.9   CMP   Recent Labs  12/30/14 1445  NA 140  K 4.3  CL 108  CO2 24  GLUCOSE 130*  BUN 23*  CREATININE 1.34*  CALCIUM 9.4  GFRNONAA 36*  GFRAA 42*   Cardiac Panel (last 3 results) Troponin (Point of Care Test)  Recent Labs  12/30/14 1456  TROPIPOC 0.10*    Thyroid  Lab Results  Component Value Date   TSH 2.04 06/19/2014    EKG: Nonspecific ST changes, sinus rhythm  Radiology: Cardiomegaly, coarse interstitial markings   IMPRESSIONS: 1.  Prolonged chest pain with abnormal point-of-care troponin with features suggestive of myocardial ischemia 2.  History of coronary artery disease moderate 3.  Hypertension currently uncontrolled 4.  Hyperlipidemia 5.  Diabetes 6.  Obesity 7.  Chronic kidney disease  PLAN: Intravenous heparin, admission to telemetry bed.  Serial EKGs and troponins.  She may need to have catheterization if she develops a serial rise in her troponins. Check ECHO.  Hold ARB in case of cath.   Signed: Kerry Hough MD  [facility]  12/30/2014, 4:30 PM

## 2014-12-30 NOTE — Progress Notes (Signed)
ANTICOAGULATION CONSULT NOTE - Initial Consult  Pharmacy Consult for heparin Indication: ACS/STEMI  Patient Measurements: Weight: 201 lb 2 oz (91.23 kg)  IBW is 52.4 kg Heparin Dosing Weight: 73 kg  Vital Signs: Temp: 98.1 F (36.7 C) (11/06 1435) BP: 171/55 mmHg (11/06 1514) Pulse Rate: 62 (11/06 1514)  Labs:  Recent Labs  12/30/14 1445  HGB 11.5*  HCT 34.1*  PLT PENDING    Estimated Creatinine Clearance: 32.2 mL/min (by C-G formula based on Cr of 1.47).   Medical History: Past Medical History  Diagnosis Date  . Hypertension   . Diabetes mellitus   . Obesity   . Arthritis   . Hyperlipidemia 08/18/2011  . B12 deficiency 08/18/2011    Assessment: 81 yo F presents on 11/6 with chest pain. Pharmacy consulted to dose heparin. No PTA anticoag. Hgb low at 11.5, plts pending. No s/s of bleed.  Goal of Therapy:  Heparin level 0.3-0.7 units/ml Monitor platelets by anticoagulation protocol: Yes   Plan:  Give 4,000 units heparin BOLUS Start heparin gtt at 850 units/hr Check 8 hr HL Monitor daily HL, CBC, s/s of bleed   Wister Hoefle J 12/30/2014,3:55 PM

## 2014-12-30 NOTE — ED Provider Notes (Signed)
CSN: [id number]     Arrival date & time 12/30/14  1425 History   First MD Initiated Contact with Patient 12/30/14 1519     Chief Complaint  Patient presents with  . Chest Pain     (Consider location/radiation/quality/duration/timing/severity/associated sxs/prior Treatment) HPI Comments: LUANE ROCHON is a 81 y.o. female with a PMHx of HTN, DM2, HLD, periph edema, CKD, vit B12 deficiency, and arthritis, who presents to the ED with complaints of sudden onset chest pain while at rest that started around 11 AM she was sitting at church. She reports the pain was initially 10/10 but has improved to a 5/10. She describes the pain as squeezing in the left chest just underneath her breast, nonradiating, constant, with no known aggravating factors, unchanged with exertion or deep inspiration, and with no treatments tried prior to arrival. She reports that she has had chronic bilateral lower trauma the swelling which is unchanged. Chart review reveals she went to her PCP Dr. Jenny Reichmann Velora Heckler) on 10/28 and he ordered an echo to r/o CHF given her periph edema. No results in the system of this exam. Patient reports that this test is not scheduled until Tuesday. Chart review reveals that she has had a cardiac cath in 2012 done by Dr. Percival Spanish, the patient states that she is now followed up with any cardiologist since then.  She denies any fevers, chills, lightheadedness, diaphoresis, recent cough, shortness of breath, claudication, orthopnea, abdominal pain, nausea, vomiting, diarrhea, constipation, dysuria, hematuria, numbness, tingling, or weakness. She is a nonsmoker. No family history of cardiac disease, no personal or family history of DVT/PE. She denies any recent travel/surgery/immobilization, active cancer, or current estrogen use.  Patient is a 81 y.o. female presenting with chest pain. The history is provided by the patient and medical records. No language interpreter was used.  Chest Pain Pain location:  L  chest Pain quality comment:  Squeezing Pain radiates to:  Does not radiate Pain radiates to the back: no   Pain severity:  Severe Onset quality:  Sudden Duration:  4 hours Timing:  Constant Progression:  Improving Chronicity:  New Context: at rest   Relieved by:  None tried Worsened by:  Nothing tried Ineffective treatments:  None tried Associated symptoms: lower extremity edema (chronic and unchanged)   Associated symptoms: no abdominal pain, no back pain, no claudication, no cough, no diaphoresis, no fever, no nausea, no near-syncope, no numbness, no orthopnea, no shortness of breath, not vomiting and no weakness   Risk factors: coronary artery disease, diabetes mellitus, high cholesterol and hypertension   Risk factors: no birth control, no immobilization, no prior DVT/PE, no smoking and no surgery     Past Medical History  Diagnosis Date  . Hypertension   . Diabetes mellitus   . Obesity   . Arthritis   . Hyperlipidemia 08/18/2011  . B12 deficiency 08/18/2011   Past Surgical History  Procedure Laterality Date  . Appendectomy    . Tumor removed      Abdomen (benign)  . Back surgery    . Abdominal hysterectomy  1972  . Left heart catheterization with coronary angiogram N/A 01/06/2011    Procedure: LEFT HEART CATHETERIZATION WITH CORONARY ANGIOGRAM;  Surgeon: Minus Breeding, MD;  Location: [facility] CATH LAB;  Service: Cardiovascular;  Laterality: N/A;   Family History  Problem Relation Age of Onset  . Diabetes Mother   . Arthritis Mother   . Hypertension Mother    Social History  Substance Use Topics  .  Smoking status: Former Smoker    Types: Cigarettes    Quit date: 02/23/1993  . Smokeless tobacco: None  . Alcohol Use: No   OB History    No data available     Review of Systems  Constitutional: Negative for fever, chills and diaphoresis.  Respiratory: Negative for cough and shortness of breath.   Cardiovascular: Positive for chest pain and leg swelling (chronic and  unchanged). Negative for orthopnea, claudication and near-syncope.  Gastrointestinal: Negative for nausea, vomiting, abdominal pain, diarrhea and constipation.  Genitourinary: Negative for dysuria and hematuria.  Musculoskeletal: Negative for myalgias, back pain and arthralgias.  Skin: Negative for color change.  Allergic/Immunologic: Positive for immunocompromised state (diabetic).  Neurological: Negative for weakness, light-headedness and numbness.  Psychiatric/Behavioral: Negative for confusion.   10 Systems reviewed and are negative for acute change except as noted in the HPI.    Allergies  Lisinopril and Penicillins  Home Medications   Prior to Admission medications   Medication Sig Start Date End Date Taking? Authorizing Provider  amLODipine (NORVASC) 10 MG tablet TAKE 1 TABLET BY MOUTH EVERY DAY 12/20/13   Biagio Borg, MD  aspirin 81 MG tablet Take 81 mg by mouth daily.    Historical Provider, MD  atorvastatin (LIPITOR) 20 MG tablet TAKE 1 TABLET BY MOUTH EVERY DAY 12/14/14   Biagio Borg, MD  COD LIVER OIL PO Take 1 capsule by mouth daily.      Historical Provider, MD  hydrochlorothiazide (HYDRODIURIL) 25 MG tablet Take 1 tablet (25 mg total) by mouth daily. As needed for swelling 12/21/14   Biagio Borg, MD  metFORMIN (GLUCOPHAGE) 500 MG tablet TAKE ONE TABLET TWICE DAILY 11/23/14   Biagio Borg, MD  nitroGLYCERIN (NITROSTAT) 0.4 MG SL tablet Place 1 tablet (0.4 mg total) under the tongue every 5 (five) minutes as needed for chest pain. 01/05/11 01/05/12  Minus Breeding, MD  valsartan (DIOVAN) 80 MG tablet TAKE 1 TABLET BY MOUTH EVERY DAY 11/23/14   Biagio Borg, MD  vitamin B-12 (CYANOCOBALAMIN) 500 MCG tablet Take 500 mcg by mouth daily.    Historical Provider, MD  VITAMIN E PO Take 1 capsule by mouth daily.      Historical Provider, MD   BP 171/52 mmHg  Pulse 71  Temp(Src) 98.1 F (36.7 C)  Resp 18  Wt 201 lb 2 oz (91.23 kg)  SpO2 97% Physical Exam  Constitutional:  She is oriented to person, place, and time. Vital signs are normal. She appears well-developed and well-nourished.  Non-toxic appearance. No distress.  Afebrile, nontoxic, NAD, mildly hypertensive  HENT:  Head: Normocephalic and atraumatic.  Mouth/Throat: Oropharynx is clear and moist and mucous membranes are normal.  Eyes: Conjunctivae and EOM are normal. Right eye exhibits no discharge. Left eye exhibits no discharge.  Neck: Normal range of motion. Neck supple. No JVD present.  No JVD noted  Cardiovascular: Normal rate, regular rhythm, normal heart sounds and intact distal pulses.  Exam reveals no gallop and no friction rub.   No murmur heard. Pulses:      Dorsalis pedis pulses are 2+ on the right side, and 1+ on the left side.  RRR, nl s1/s2, no m/r/g, distal pulses intact although diminished slightly in the L foot (DP pulse), trace b/l pedal edema   Pulmonary/Chest: Effort normal and breath sounds normal. No respiratory distress. She has no decreased breath sounds. She has no wheezes. She has no rhonchi. She has no rales. She exhibits tenderness.  She exhibits no crepitus, no deformity and no retraction.    CTAB in all lung fields, no w/r/r, no hypoxia or increased WOB, speaking in full sentences, SpO2 100% on RA  Mild chest wall TTP under L breast, no crepitus or retractions, no deformities  Abdominal: Soft. Normal appearance and bowel sounds are normal. She exhibits no distension. There is no tenderness. There is no rigidity, no rebound, no guarding, no CVA tenderness, no tenderness at McBurney's point and negative Murphy's sign.  Musculoskeletal: Normal range of motion.  MAE x4 Strength and sensation grossly intact Distal pulses intact as discussed above Trace b/l pedal edema, neg homan's bilaterally  Neurological: She is alert and oriented to person, place, and time. She has normal strength. No sensory deficit.  Skin: Skin is warm, dry and intact. No rash noted.  Psychiatric: She has  a normal mood and affect.  Nursing note and vitals reviewed.   ED Course  Procedures (including critical care time)  CRITICAL CARE-NSTEMI/elevated troponin Performed by: Corine Shelter   Total critical care time: 30 minutes  Critical care time was exclusive of separately billable procedures and treating other patients.  Critical care was necessary to treat or prevent imminent or life-threatening deterioration.  Critical care was time spent personally by me on the following activities: development of treatment plan with patient and/or surrogate as well as nursing, discussions with consultants, evaluation of patient's response to treatment, examination of patient, obtaining history from patient or surrogate, ordering and performing treatments and interventions, ordering and review of laboratory studies, ordering and review of radiographic studies, pulse oximetry and re-evaluation of patient's condition.  Labs Review Labs Reviewed  BASIC METABOLIC PANEL - Abnormal; Notable for the following:    Glucose, Bld 130 (*)    BUN 23 (*)    Creatinine, Ser 1.34 (*)    GFR calc non Af Amer 36 (*)    GFR calc Af Amer 42 (*)    All other components within normal limits  CBC - Abnormal; Notable for the following:    Hemoglobin 11.5 (*)    HCT 34.1 (*)    All other components within normal limits  I-STAT TROPOININ, ED - Abnormal; Notable for the following:    Troponin i, poc 0.10 (*)    All other components within normal limits  BRAIN NATRIURETIC PEPTIDE  HEPARIN LEVEL (UNFRACTIONATED)  HEMOGLOBIN A1C    Imaging Review Dg Chest 2 View  12/30/2014  CLINICAL DATA:  Chest pain since 11 a.m. today. EXAM: CHEST  2 VIEW COMPARISON:  None. FINDINGS: Borderline enlarged cardiac silhouette. Mildly prominent pulmonary vasculature and interstitial markings. No pleural fluid. Thoracic spine degenerative changes. IMPRESSION: 1. Borderline cardiomegaly and mild pulmonary vascular  congestion. 2. Mild chronic interstitial lung disease. Electronically Signed   By: Claudie Revering M.D.   On: 12/30/2014 15:31    I have personally reviewed and evaluated these images and lab results as part of my medical decision-making.  Op Note by Minus Breeding, MD at 01/06/2011 2:34 PM    Author: Minus Breeding, MD Service: Cardiac Cath Author Type: Physician   Filed: 01/06/2011 2:42 PM Note Time: 01/06/2011 2:34 PM Status: Signed   Editor: Minus Breeding, MD (Physician)     Expand All Collapse All   Cardiac Catheterization Procedure Note  Name: HENNA DERDERIAN MRN: [id number] DOB: 07/05/1928  Procedure: Left Heart Cath, Selective Coronary Angiography, LV angiography  Indication: Chest Pain  Procedural details: The right radial was prepped, draped, and anesthetized with  1% lidocaine. Using modified Seldinger technique, a 5 French sheath was introduced into the right radial artery. Standard Judkins catheters were used for coronary angiography and left ventriculography. Catheter exchanges were performed over a guidewire. There were no immediate procedural complications. The patient was transferred to the post catheterization recovery area for further monitoring.  Procedural Findings: Hemodynamics:   AO 135/50 LV 132/18  Coronary angiography:  Coronary dominance: Right  Left mainstem: Normal  Left anterior descending (LAD): Tortuous. Large vessel wraps the apex. Mild luminal irregularities. D1 small and normal. D2 moderate and normal  Left circumflex (LCx): AV groove ostial 25%. Diffuse luminal irregularities. RI moderate sized and branching. Diffuse luminal irregularities. Long 50% stenosis before a small PL.  Right coronary artery (RCA): Moderate calcification. Prox 50% focal lesion. Long distal 50% tandem lesions before the PDA. PDA moderate with  luminal irregularities. PL moderate and normal.  Left ventriculography: Left ventricular systolic function is normal, LVEF is estimated at 55-65%, there is no significant mitral regurgitation   Final Conclusions: Nonobstructive CAD with preserved EF  Recommendations: Medical management with risk reduction.    Minus Breeding 01/06/2011, 2:35 PM         EKG Interpretation   Date/Time:  Sunday December 30 2014 14:27:28 EST Ventricular Rate:  70 PR Interval:  146 QRS Duration: 82 QT Interval:  394 QTC Calculation: 425 R Axis:   73 Text Interpretation:  Normal sinus rhythm Nonspecific T wave abnormality  No significant change since last tracing Confirmed by Maryan Rued  MD,  Loree Fee ([id number]) on 12/30/2014 3:15:38 PM      MDM   Final diagnoses:  Chest pain, unspecified chest pain type  Peripheral edema  Elevated troponin  NSTEMI (non-ST elevated myocardial infarction) [facility])    81 y.o. female here with sudden onset CP while at rest, no other symptoms. Mildly reproducible on exam. Trop elevated at 0.10. On exam, trace b/l periph edema, no tachycardia/hypoxia/SOB, doubt PE. EKG with some T flattening, nonspecific T changes. Prior cardiac cath in 2012 with nonobstructive CAD and preserved EF. No record of stress test or echo. Was sent by her PCP for echo, scheduled for Tuesday, to eval for CHF given the ongoing periph edema. No SOB complaint but given possible CHF given the periph edema, will check BNP. Awaiting labs and CXR, but given elevated trop will give ASA/NTG and consult cardiology. Will hold on heparin until I've spoken with cardiology. Will reassess shortly.   3:50 PM Dr. Wynonia Lawman of Cardiology returning call, will come see pt. Would like heparin started. CXR resulting showing borderline cardiomegaly with some mild pulm vasc congestion and some mild chronic ILD. Will recheck shortly and follow closely with cardiology recommendations.   4:41 PM BNP still pending. BMP with  mildly elevated BUN/Cr similar to prior exams. CBC unremarkable aside from mildly low H/H at 11.5/34.1. Cardiology at bedside and admitting pt. Please see their notes for further documentation of care.   BP 171/55 mmHg  Pulse 62  Temp(Src) 98.1 F (36.7 C)  Resp 17  Wt 201 lb 2 oz (91.23 kg)  SpO2 100%  Meds ordered this encounter  Medications  . aspirin tablet 325 mg    Sig:   . nitroGLYCERIN (NITROSTAT) SL tablet 0.4 mg    Sig:   . morphine 4 MG/ML injection 4 mg    Sig:   . heparin bolus via infusion 4,000 Units    Sig:   . heparin ADULT infusion 100 units/mL (25000 units/250 mL)  SigZacarias Pontes, PA-C 12/30/14 [place], MD 12/30/14 1644

## 2014-12-31 ENCOUNTER — Other Ambulatory Visit (HOSPITAL_COMMUNITY): Payer: Medicare Other

## 2014-12-31 ENCOUNTER — Encounter (HOSPITAL_COMMUNITY)
Admission: EM | Disposition: A | Discharge status: Home / Self Care | Payer: Self-pay | Source: Home / Self Care | Attending: Cardiology

## 2014-12-31 DIAGNOSIS — I200 Unstable angina: Secondary | ICD-10-CM

## 2014-12-31 DIAGNOSIS — I214 Non-ST elevation (NSTEMI) myocardial infarction: Secondary | ICD-10-CM

## 2014-12-31 DIAGNOSIS — I2510 Atherosclerotic heart disease of native coronary artery without angina pectoris: Secondary | ICD-10-CM

## 2014-12-31 HISTORY — PX: CARDIAC CATHETERIZATION: SHX172

## 2014-12-31 LAB — LIPID PANEL
CHOL/HDL RATIO: 2.9 ratio
CHOLESTEROL: 117 mg/dL (ref 0–200)
HDL: 40 mg/dL — AB (ref >40)
LDL Cholesterol: 63 mg/dL (ref 0–99)
Triglycerides: 70 mg/dL (ref <150)
VLDL: 14 mg/dL (ref 0–40)

## 2014-12-31 LAB — BASIC METABOLIC PANEL
Anion gap: 32 — ABNORMAL HIGH (ref 5–15)
BUN: 21 mg/dL — AB (ref 6–20)
CHLORIDE: 99 mmol/L — AB (ref 101–111)
CO2: 18 mmol/L — ABNORMAL LOW (ref 22–32)
CREATININE: 1.25 mg/dL — AB (ref 0.44–1.00)
Calcium: 6.9 mg/dL — ABNORMAL LOW (ref 8.9–10.3)
GFR calc Af Amer: 45 mL/min — ABNORMAL LOW (ref >60)
GFR calc non Af Amer: 39 mL/min — ABNORMAL LOW (ref >60)
GLUCOSE: 102 mg/dL — AB (ref 65–99)
POTASSIUM: 4.2 mmol/L (ref 3.5–5.1)
Sodium: 149 mmol/L — ABNORMAL HIGH (ref 135–145)

## 2014-12-31 LAB — TROPONIN I
TROPONIN I: 0.07 ng/mL — AB (ref <0.031)
Troponin I: 0.06 ng/mL — ABNORMAL HIGH (ref <0.031)

## 2014-12-31 LAB — GLUCOSE, CAPILLARY
GLUCOSE-CAPILLARY: 99 mg/dL (ref 65–99)
GLUCOSE-CAPILLARY: 164 mg/dL — AB (ref 65–99)
Glucose-Capillary: 130 mg/dL — ABNORMAL HIGH (ref 65–99)

## 2014-12-31 LAB — CBC
HCT: 34.5 % — ABNORMAL LOW (ref 36.0–46.0)
Hemoglobin: 11.5 g/dL — ABNORMAL LOW (ref 12.0–15.0)
MCH: 29 pg (ref 26.0–34.0)
MCHC: 33.3 g/dL (ref 30.0–36.0)
MCV: 87.1 fL (ref 78.0–100.0)
PLATELETS: 233 10*3/uL (ref 150–400)
RBC: 3.96 MIL/uL (ref 3.87–5.11)
RDW: 14.1 % (ref 11.5–15.5)
WBC: 9.2 10*3/uL (ref 4.0–10.5)

## 2014-12-31 LAB — HEPARIN LEVEL (UNFRACTIONATED)
HEPARIN UNFRACTIONATED: 0.25 [IU]/mL — AB (ref 0.30–0.70)
Heparin Unfractionated: 0.56 IU/mL (ref 0.30–0.70)

## 2014-12-31 LAB — HEMOGLOBIN A1C
Hgb A1c MFr Bld: 7.2 % — ABNORMAL HIGH (ref 4.8–5.6)
MEAN PLASMA GLUCOSE: 160 mg/dL

## 2014-12-31 LAB — PROTIME-INR
INR: 1.06 (ref 0.00–1.49)
PROTHROMBIN TIME: 14 s (ref 11.6–15.2)

## 2014-12-31 SURGERY — LEFT HEART CATH AND CORONARY ANGIOGRAPHY

## 2014-12-31 MED ORDER — SODIUM CHLORIDE 0.9 % IJ SOLN: 3.0000 mL | INTRAMUSCULAR | Status: DC | PRN

## 2014-12-31 MED ORDER — ASPIRIN 81 MG PO CHEW: 81.0000 mg | CHEWABLE_TABLET | ORAL | Status: DC

## 2014-12-31 MED ORDER — FENTANYL CITRATE (PF) 100 MCG/2ML IJ SOLN
INTRAMUSCULAR | Status: DC | PRN
  Administered 2014-12-31: 25 ug via INTRAVENOUS

## 2014-12-31 MED ORDER — SODIUM CHLORIDE 0.9 % IJ SOLN
3.0000 mL | Freq: Two times a day (BID) | INTRAMUSCULAR | Status: DC
  Administered 2014-12-31 – 2015-01-01 (×2): 3 mL via INTRAVENOUS

## 2014-12-31 MED ORDER — HEPARIN SODIUM (PORCINE) 1000 UNIT/ML IJ SOLN
INTRAMUSCULAR | Status: AC
  Filled 2014-12-31: qty 1

## 2014-12-31 MED ORDER — LIDOCAINE HCL (PF) 1 % IJ SOLN
INTRAMUSCULAR | Status: AC
  Filled 2014-12-31: qty 30

## 2014-12-31 MED ORDER — MIDAZOLAM HCL 2 MG/2ML IJ SOLN
INTRAMUSCULAR | Status: AC
  Filled 2014-12-31: qty 4

## 2014-12-31 MED ORDER — ONDANSETRON HCL 4 MG/2ML IJ SOLN
INTRAMUSCULAR | Status: DC | PRN
  Administered 2014-12-31: 4 mg via INTRAVENOUS

## 2014-12-31 MED ORDER — LIDOCAINE HCL (PF) 1 % IJ SOLN
INTRAMUSCULAR | Status: DC | PRN
  Administered 2014-12-31: 5 mL

## 2014-12-31 MED ORDER — ISOSORBIDE MONONITRATE ER 30 MG PO TB24
30.0000 mg | ORAL_TABLET | Freq: Every day | ORAL | Status: DC
  Administered 2014-12-31 – 2015-01-01 (×2): 30 mg via ORAL
  Filled 2014-12-31 (×2): qty 1

## 2014-12-31 MED ORDER — SODIUM CHLORIDE 0.9 % WEIGHT BASED INFUSION: 3.0000 mL/kg/h | INTRAVENOUS | Status: AC

## 2014-12-31 MED ORDER — SODIUM CHLORIDE 0.9 % IJ SOLN
3.0000 mL | Freq: Two times a day (BID) | INTRAMUSCULAR | Status: DC
  Administered 2014-12-31: 3 mL via INTRAVENOUS

## 2014-12-31 MED ORDER — SODIUM CHLORIDE 0.9 % WEIGHT BASED INFUSION
3.0000 mL/kg/h | INTRAVENOUS | Status: DC
  Administered 2014-12-31: 3 mL/kg/h via INTRAVENOUS

## 2014-12-31 MED ORDER — SODIUM CHLORIDE 0.9 % IV SOLN: 250.0000 mL | INTRAVENOUS | Status: DC | PRN

## 2014-12-31 MED ORDER — IOHEXOL 350 MG/ML SOLN
INTRAVENOUS | Status: DC | PRN
  Administered 2014-12-31: 90 mL via INTRA_ARTERIAL

## 2014-12-31 MED ORDER — HEPARIN (PORCINE) IN NACL 100-0.45 UNIT/ML-% IJ SOLN: 1000.0000 [IU]/h | INTRAMUSCULAR | Status: DC

## 2014-12-31 MED ORDER — MIDAZOLAM HCL 2 MG/2ML IJ SOLN
INTRAMUSCULAR | Status: DC | PRN
  Administered 2014-12-31: 1 mg via INTRAVENOUS

## 2014-12-31 MED ORDER — HEPARIN SODIUM (PORCINE) 1000 UNIT/ML IJ SOLN
INTRAMUSCULAR | Status: DC | PRN
  Administered 2014-12-31: 5000 [IU] via INTRAVENOUS

## 2014-12-31 MED ORDER — FENTANYL CITRATE (PF) 100 MCG/2ML IJ SOLN
INTRAMUSCULAR | Status: AC
  Filled 2014-12-31: qty 4

## 2014-12-31 MED ORDER — VERAPAMIL HCL 2.5 MG/ML IV SOLN
INTRAVENOUS | Status: DC | PRN
  Administered 2014-12-31: 8 mL via INTRA_ARTERIAL

## 2014-12-31 MED ORDER — VERAPAMIL HCL 2.5 MG/ML IV SOLN
INTRAVENOUS | Status: AC
  Filled 2014-12-31: qty 2

## 2014-12-31 MED ORDER — SODIUM CHLORIDE 0.9 % WEIGHT BASED INFUSION
1.0000 mL/kg/h | INTRAVENOUS | Status: DC
  Administered 2014-12-31: 1 mL/kg/h via INTRAVENOUS

## 2014-12-31 SURGICAL SUPPLY — 10 items
CATH INFINITI 5 FR JL3.5 (CATHETERS) ×3 IMPLANT
CATH INFINITI JR4 5F (CATHETERS) ×3 IMPLANT
DEVICE RAD COMP TR BAND LRG (VASCULAR PRODUCTS) ×3 IMPLANT
GLIDESHEATH SLEND SS 6F .021 (SHEATH) ×3 IMPLANT
KIT HEART LEFT (KITS) ×3 IMPLANT
PACK CARDIAC CATHETERIZATION (CUSTOM PROCEDURE TRAY) ×3 IMPLANT
TRANSDUCER W/STOPCOCK (MISCELLANEOUS) ×3 IMPLANT
TUBING CIL FLEX 10 FLL-RA (TUBING) ×3 IMPLANT
WIRE HITORQ VERSACORE ST 145CM (WIRE) ×2 IMPLANT
WIRE SAFE-T 1.5MM-J .035X260CM (WIRE) ×3 IMPLANT

## 2014-12-31 NOTE — Progress Notes (Signed)
UR Completed Anntionette Madkins Graves-Bigelow, RN,BSN [phone]  

## 2014-12-31 NOTE — Progress Notes (Addendum)
ANTICOAGULATION CONSULT NOTE - Follow-up Consult  Pharmacy Consult for heparin Indication: ACS/STEMI  Patient Measurements: Weight: 196 lb 1.6 oz (88.95 kg)  IBW is 52.4 kg Heparin Dosing Weight: 73 kg  Vital Signs: Temp: 98.4 F (36.9 C) (11/07 0452) Temp Source: Oral (11/07 0452) BP: 152/52 mmHg (11/07 0452) Pulse Rate: 80 (11/07 0452)  Labs:  Recent Labs  12/30/14 1445 12/30/14 1800 12/31/14 0059 12/31/14 0608 12/31/14 1020  HGB 11.5*  --  11.5*  --   --   HCT 34.1*  --  34.5*  --   --   PLT 250  --  233  --   --   LABPROT  --   --   --   --  14.0  INR  --   --   --   --  1.06  HEPARINUNFRC  --   --  0.25*  --  0.56  CREATININE 1.34* 1.40* 1.25*  --   --   TROPONINI  --  0.07* 0.07* 0.06*  --     Estimated Creatinine Clearance: 37.3 mL/min (by C-G formula based on Cr of 1.25).   Assessment: 81 yo F on heparin for r/o ACS. Heparin level therapeutic (0.56) on 1000 units/hr. CBC stable. No issues with line or bleeding reported per RN.  Goal of Therapy:  Heparin level 0.3-0.7 units/ml Monitor platelets by anticoagulation protocol: Yes   Plan:  Continue IV heparin at 1000 units/hr. Confirm heparin level at 1900 pm, unless turned off for cath lab. Daily heparin level and CBC.  Uvaldo Rising, BCPS  Clinical Pharmacist Pager [phone]  12/31/2014 11:42 AM    Addendum: S/p cath lab, likely needs intervention, deferring until tomorrow due to dye load.  Pharmacy asked to resume IV heparin 8 hrs after sheath pulled.  According to cath lab records, sheath out at 1309 PM.  Will resume heparin at previous rate of 1000 units/hr at 2115 PM.  Check heparin level 8 hrs after gtt started.  F/u plans to return to cath lab.  Uvaldo Rising, BCPS  Clinical Pharmacist Pager [phone]  12/31/2014 1:50 PM

## 2014-12-31 NOTE — Care Management Note (Signed)
Case Management Note  Patient Details  Name: Alexandra Gross MRN: [id number] Date of Birth: 21-Aug-1931  Subjective/Objective: Pt admitted for unstable angina.                    Action/Plan: CM will continue to monitor for disposition needs.    Expected Discharge Date:                  Expected Discharge Plan:  Home/Self Care  In-House Referral:  NA  Discharge planning Services  CM Consult  Post Acute Care Choice:    Choice offered to:     DME Arranged:    DME Agency:     HH Arranged:    HH Agency:     Status of Service:  In process, will continue to follow  Medicare Important Message Given:    Date Medicare IM Given:    Medicare IM give by:    Date Additional Medicare IM Given:    Additional Medicare Important Message give by:     If discussed at [place] of Stay Meetings, dates discussed:    Additional Comments:  Bethena Roys, RN 12/31/2014, 11:53 AM

## 2014-12-31 NOTE — Interval H&P Note (Signed)
History and Physical Interval Note:  12/31/2014 12:38 PM  Alexandra Gross  has presented today for surgery, with the diagnosis of cp  The various methods of treatment have been discussed with the patient and family. After consideration of risks, benefits and other options for treatment, the patient has consented to  Procedure(s): Left Heart Cath and Coronary Angiography (N/A) as a surgical intervention .  The patient's history has been reviewed, patient examined, no change in status, stable for surgery.  I have reviewed the patient's chart and labs.  Questions were answered to the patient's satisfaction.    Cath Lab Visit (complete for each Cath Lab visit)  Clinical Evaluation Leading to the Procedure:   ACS: Yes.    Non-ACS:    Anginal Classification: CCS IV  Anti-ischemic medical therapy: Minimal Therapy (1 class of medications)  Non-Invasive Test Results: No non-invasive testing performed  Prior CABG: No previous CABG       Alexandra Gross [facility] 12/31/2014 12:39 PM

## 2014-12-31 NOTE — Progress Notes (Signed)
I discussed cardiac cath results with the patient. We reviewed treatment options including PCI and medical therapy. I discussed the complexity of PCI with increased risk. Risk of MI about 5-6% and death 1%. Indication for PCI would be relief of angina. The patient still lives alone but is fairly sedentary. She does light house work, goes to [employer] and crochets. Prior to admission her anginal episodes have been sporadic and infrequent. After discussion we agreed that it would be best to try her on medical therapy first. Will add Imdur daily and instruct her in use of sl Ntg. She is on amlodipine. May not tolerate a beta blocker with slow resting pulse. If she has refractory angina despite optimal medical therapy then I think high risk PCI would be justified.   Tijana Walder Martinique MD, [facility]

## 2014-12-31 NOTE — H&P (View-Only) (Signed)
    Subjective:  Denies CP or dyspnea   Objective:  Filed Vitals:   12/30/14 1514 12/30/14 1700 12/30/14 2114 12/31/14 0452  BP: 171/55 166/46 165/61 152/52  Pulse: 62 63 65 80  Temp:   98.1 F (36.7 C) 98.4 F (36.9 C)  TempSrc:   Oral Oral  Resp: 17 14 20    Weight:    88.95 kg (196 lb 1.6 oz)  SpO2: 100% 99% 97% 98%    Intake/Output from previous day:  Intake/Output Summary (Last 24 hours) at 12/31/14 6720 Last data filed at 12/31/14 0800  Gross per 24 hour  Intake    240 ml  Output      0 ml  Net    240 ml    Physical Exam: Physical exam: Well-developed obese in no acute distress.  Skin is warm and dry.  HEENT is normal.  Neck is supple.  Chest is clear to auscultation with normal expansion.  Cardiovascular exam is regular rate and rhythm.  Abdominal exam nontender or distended. No masses palpated. Extremities show no edema. neuro grossly intact    Lab Results: Basic Metabolic Panel:  Recent Labs  12/30/14 1800 12/31/14 0059  NA 141 149*  K 4.1 4.2  CL 108 99*  CO2 24 18*  GLUCOSE 114* 102*  BUN 23* 21*  CREATININE 1.40* 1.25*  CALCIUM 9.3 6.9*   CBC:  Recent Labs  12/30/14 1445 12/31/14 0059  WBC 9.7 9.2  HGB 11.5* 11.5*  HCT 34.1* 34.5*  MCV 86.3 87.1  PLT 250 233   Cardiac Enzymes:  Recent Labs  12/30/14 1800 12/31/14 0059 12/31/14 0608  TROPONINI 0.07* 0.07* 0.06*     Assessment/Plan:  81 year old female with history of hypertension, diabetes mellitus, hyperlipidemia, nonobstructive coronary disease admitted with Unstable angina.  1 unstable angina-patient is pain-free. Troponin is minimally elevated but there is no clear trend. However electrocardiogram this morning shows increased anterior T-wave inversion. I feel definitive evaluation is warranted. Plan cardiac catheterization. The risks and benefits were discussed and she agrees to proceed. She has mild renal insufficiency. Hold ARB and diuretic. Hold Glucophage now and  for 48 hours following procedure. Hydrated precatheterization. No ventriculogram. Continue aspirin, heparin and statin. Heart rate in the high 40s. No beta blocker. 2 Hypertension-continue present medications and adjust based on follow-up readings. 3 hyperlipidemia-continue statin. 4 diabetes mellitus-hold Glucophage for cardiac catheterization. Follow CBGs. 5 chronic stage III renal insufficiency-follow renal function following procedure. Pt to fu with Dr Percival Spanish following DC. Kirk Ruths 12/31/2014, 9:03 AM

## 2014-12-31 NOTE — Progress Notes (Signed)
    Subjective:  Denies CP or dyspnea   Objective:  Filed Vitals:   12/30/14 1514 12/30/14 1700 12/30/14 2114 12/31/14 0452  BP: 171/55 166/46 165/61 152/52  Pulse: 62 63 65 80  Temp:   98.1 F (36.7 C) 98.4 F (36.9 C)  TempSrc:   Oral Oral  Resp: 17 14 20    Weight:    88.95 kg (196 lb 1.6 oz)  SpO2: 100% 99% 97% 98%    Intake/Output from previous day:  Intake/Output Summary (Last 24 hours) at 12/31/14 7124 Last data filed at 12/31/14 0800  Gross per 24 hour  Intake    240 ml  Output      0 ml  Net    240 ml    Physical Exam: Physical exam: Well-developed obese in no acute distress.  Skin is warm and dry.  HEENT is normal.  Neck is supple.  Chest is clear to auscultation with normal expansion.  Cardiovascular exam is regular rate and rhythm.  Abdominal exam nontender or distended. No masses palpated. Extremities show no edema. neuro grossly intact    Lab Results: Basic Metabolic Panel:  Recent Labs  12/30/14 1800 12/31/14 0059  NA 141 149*  K 4.1 4.2  CL 108 99*  CO2 24 18*  GLUCOSE 114* 102*  BUN 23* 21*  CREATININE 1.40* 1.25*  CALCIUM 9.3 6.9*   CBC:  Recent Labs  12/30/14 1445 12/31/14 0059  WBC 9.7 9.2  HGB 11.5* 11.5*  HCT 34.1* 34.5*  MCV 86.3 87.1  PLT 250 233   Cardiac Enzymes:  Recent Labs  12/30/14 1800 12/31/14 0059 12/31/14 0608  TROPONINI 0.07* 0.07* 0.06*     Assessment/Plan:  81 year old female with history of hypertension, diabetes mellitus, hyperlipidemia, nonobstructive coronary disease admitted with Unstable angina.  1 unstable angina-patient is pain-free. Troponin is minimally elevated but there is no clear trend. However electrocardiogram this morning shows increased anterior T-wave inversion. I feel definitive evaluation is warranted. Plan cardiac catheterization. The risks and benefits were discussed and she agrees to proceed. She has mild renal insufficiency. Hold ARB and diuretic. Hold Glucophage now and  for 48 hours following procedure. Hydrated precatheterization. No ventriculogram. Continue aspirin, heparin and statin. Heart rate in the high 40s. No beta blocker. 2 Hypertension-continue present medications and adjust based on follow-up readings. 3 hyperlipidemia-continue statin. 4 diabetes mellitus-hold Glucophage for cardiac catheterization. Follow CBGs. 5 chronic stage III renal insufficiency-follow renal function following procedure. Pt to fu with Dr Percival Spanish following DC. Kirk Ruths 12/31/2014, 9:03 AM

## 2014-12-31 NOTE — Progress Notes (Signed)
ANTICOAGULATION CONSULT NOTE - Follow-up Consult  Pharmacy Consult for heparin Indication: ACS/STEMI  Patient Measurements: Weight: 201 lb 2 oz (91.23 kg)  IBW is 52.4 kg Heparin Dosing Weight: 73 kg  Vital Signs: Temp: 98.1 F (36.7 C) (11/06 2114) Temp Source: Oral (11/06 2114) BP: 165/61 mmHg (11/06 2114) Pulse Rate: 65 (11/06 2114)  Labs:  Recent Labs  12/30/14 1445 12/30/14 1800 12/31/14 0059  HGB 11.5*  --  11.5*  HCT 34.1*  --  34.5*  PLT 250  --  233  HEPARINUNFRC  --   --  0.25*  CREATININE 1.34* 1.40*  --   TROPONINI  --  0.07*  --     Estimated Creatinine Clearance: 33.8 mL/min (by C-G formula based on Cr of 1.4).   Assessment: 81 yo F on heparin for r/o ACS. Heparin level subtherapeutic (0.25) on 850 units/hr. CBC stable. No issues with line or bleeding reported per RN.  Goal of Therapy:  Heparin level 0.3-0.7 units/ml Monitor platelets by anticoagulation protocol: Yes   Plan:  Increase heparin gtt to 1000 units/hr Check 8 hr HL  Sherlon Handing, PharmD, BCPS Clinical pharmacist, pager [phone] 12/31/2014,2:26 AM

## 2015-01-01 ENCOUNTER — Other Ambulatory Visit (HOSPITAL_COMMUNITY): Payer: Medicare Other

## 2015-01-01 ENCOUNTER — Inpatient Hospital Stay (HOSPITAL_COMMUNITY): Payer: Medicare Other

## 2015-01-01 ENCOUNTER — Telehealth: Payer: Self-pay | Admitting: Cardiology

## 2015-01-01 ENCOUNTER — Encounter (HOSPITAL_COMMUNITY): Payer: Self-pay | Admitting: Cardiology

## 2015-01-01 DIAGNOSIS — N183 Chronic kidney disease, stage 3 unspecified: Secondary | ICD-10-CM

## 2015-01-01 LAB — GLUCOSE, CAPILLARY
Glucose-Capillary: 128 mg/dL — ABNORMAL HIGH (ref 65–99)
Glucose-Capillary: 107 mg/dL — ABNORMAL HIGH (ref 65–99)

## 2015-01-01 LAB — BASIC METABOLIC PANEL
Anion gap: 8 (ref 5–15)
BUN: 19 mg/dL (ref 6–20)
CHLORIDE: 107 mmol/L (ref 101–111)
CO2: 26 mmol/L (ref 22–32)
CREATININE: 1.45 mg/dL — AB (ref 0.44–1.00)
Calcium: 8.8 mg/dL — ABNORMAL LOW (ref 8.9–10.3)
GFR calc Af Amer: 38 mL/min — ABNORMAL LOW (ref >60)
GFR calc non Af Amer: 33 mL/min — ABNORMAL LOW (ref >60)
GLUCOSE: 132 mg/dL — AB (ref 65–99)
POTASSIUM: 4.4 mmol/L (ref 3.5–5.1)
SODIUM: 141 mmol/L (ref 135–145)

## 2015-01-01 LAB — CBC
HEMATOCRIT: 33.7 % — AB (ref 36.0–46.0)
Hemoglobin: 10.8 g/dL — ABNORMAL LOW (ref 12.0–15.0)
MCH: 27.9 pg (ref 26.0–34.0)
MCHC: 32 g/dL (ref 30.0–36.0)
MCV: 87.1 fL (ref 78.0–100.0)
Platelets: 228 10*3/uL (ref 150–400)
RBC: 3.87 MIL/uL (ref 3.87–5.11)
RDW: 14.2 % (ref 11.5–15.5)
WBC: 8.9 10*3/uL (ref 4.0–10.5)

## 2015-01-01 MED ORDER — NITROGLYCERIN 0.4 MG SL SUBL: 0.4000 mg | SUBLINGUAL_TABLET | SUBLINGUAL | Status: DC | PRN

## 2015-01-01 MED ORDER — VALSARTAN 80 MG PO TABS: 80.0000 mg | ORAL_TABLET | Freq: Every day | ORAL | Status: DC

## 2015-01-01 MED ORDER — ISOSORBIDE MONONITRATE ER 60 MG PO TB24: 60.0000 mg | ORAL_TABLET | Freq: Every day | ORAL | Status: DC

## 2015-01-01 MED ORDER — ATORVASTATIN CALCIUM 80 MG PO TABS: 80.0000 mg | ORAL_TABLET | Freq: Every day | ORAL | Status: DC

## 2015-01-01 MED ORDER — CLOPIDOGREL BISULFATE 75 MG PO TABS
75.0000 mg | ORAL_TABLET | Freq: Every day | ORAL | Status: DC
  Administered 2015-01-01: 75 mg via ORAL
  Filled 2015-01-01: qty 1

## 2015-01-01 MED ORDER — CLOPIDOGREL 0.02 MG/30 ML ORAL SUSPENSION
0.0200 mg | Freq: Every day | ORAL | Status: DC
  Filled 2015-01-01: qty 30

## 2015-01-01 MED ORDER — VITAMIN B-12 500 MCG PO TABS: 500.0000 ug | ORAL_TABLET | Freq: Every day | ORAL | Status: AC

## 2015-01-01 MED ORDER — CLOPIDOGREL BISULFATE 75 MG PO TABS: 75.0000 mg | ORAL_TABLET | Freq: Every day | ORAL | Status: DC

## 2015-01-01 MED ORDER — METFORMIN HCL 500 MG PO TABS: 500.0000 mg | ORAL_TABLET | Freq: Two times a day (BID) | ORAL | Status: DC

## 2015-01-01 MED ORDER — IRBESARTAN 75 MG PO TABS
75.0000 mg | ORAL_TABLET | Freq: Every day | ORAL | Status: DC
Start: 2015-01-01 — End: 2015-01-01
  Administered 2015-01-01: 75 mg via ORAL
  Filled 2015-01-01: qty 1

## 2015-01-01 NOTE — Progress Notes (Signed)
Discharge teaching and instructions reviewed. No questions. Prescriptions sent to pharmacy. Pt discahrging home with friend.

## 2015-01-01 NOTE — Discharge Instructions (Signed)

## 2015-01-01 NOTE — Progress Notes (Signed)
CARDIAC REHAB PHASE I   PRE:  Rate/Rhythm: 58 SR  BP:  Supine:   Sitting: 138/54  Standing:    SaO2: 100%RA  MODE:  Ambulation: 300 ft   POST:  Rate/Rhythm: 87 SR  BP:  Supine:   Sitting: 161/54  Standing:    SaO2: 97%RA 1310-1410 Pt walked 300 ft with hand held asst with steady gait. Denied CP or pressure. MI ed completed and did teach back. Pt will need some re enforcing as she stated she has a little problem with short term memory. Reviewed NTG use twice and the importance of letting cardiologist know if recurrence of CP relieved with one NTG and calling 911 if needed if not relieved with one. Discussed CRP 2 and will refer to [place] as pt will consider. Pt states she eats mainly fruits and vegs.  Reviewed MI restrictions and encouraged not to over do.   Graylon Good, RN BSN  01/01/2015 2:09 PM

## 2015-01-01 NOTE — Telephone Encounter (Signed)
7day TOC  Pt-fu appt Scott 01-08-15 at 10a

## 2015-01-01 NOTE — Progress Notes (Signed)
    Subjective:  Denies CP or dyspnea   Objective:  Filed Vitals:   12/31/14 1621 12/31/14 1647 12/31/14 2000 01/01/15 0500  BP: 169/83 141/61 136/55 137/60  Pulse:   68 72  Temp:   98.4 F (36.9 C) 98.5 F (36.9 C)  TempSrc:   Oral Oral  Resp: 23 20 18 18   Weight:    88.678 kg (195 lb 8 oz)  SpO2:   96% 94%    Intake/Output from previous day:  Intake/Output Summary (Last 24 hours) at 01/01/15 0910 Last data filed at 01/01/15 0800  Gross per 24 hour  Intake 1849.42 ml  Output    300 ml  Net 1549.42 ml    Physical Exam: Physical exam: Well-developed obese in no acute distress.  Skin is warm and dry.  HEENT is normal.  Neck is supple.  Chest is clear to auscultation with normal expansion.  Cardiovascular exam is regular rate and rhythm.  Abdominal exam nontender or distended. No masses palpated. Extremities show no edema. neuro grossly intact    Lab Results: Basic Metabolic Panel:  Recent Labs  12/31/14 0059 01/01/15 0314  NA 149* 141  K 4.2 4.4  CL 99* 107  CO2 18* 26  GLUCOSE 102* 132*  BUN 21* 19  CREATININE 1.25* 1.45*  CALCIUM 6.9* 8.8*   CBC:  Recent Labs  12/31/14 0059 01/01/15 0314  WBC 9.2 8.9  HGB 11.5* 10.8*  HCT 34.5* 33.7*  MCV 87.1 87.1  PLT 233 228   Cardiac Enzymes:  Recent Labs  12/30/14 1800 12/31/14 0059 12/31/14 0608  TROPONINI 0.07* 0.07* 0.06*     Assessment/Plan:  81 year old female with history of hypertension, diabetes mellitus, hyperlipidemia, nonobstructive coronary disease admitted with Unstable angina.  1 unstable angina-Patient remains pain free. I reviewed her catheterization films with Dr. Martinique. She has significant left circumflex and obtuse marginal disease. However PCI would be difficult. The plan is to attempt medical therapy for now. If she has recurrent symptoms despite medical therapy then high risk PCI could be attempted later. Continue aspirin and add Plavix. Increase Lipitor to 80 mg daily.  Check lipids and liver in 4 weeks. No beta blocker as heart rate is in the 40s at times. Continue amlodipine. Increase Imdur to 60 mg daily. Hold Glucophage for 48 hours following procedure. Ambulate today on medications and if no chest pain discharge home. 2 Hypertension-Resume low-dose ARB. Hold diuretics. 3 hyperlipidemia-continue Lipitor but increase to 80 mg daily. 4 diabetes mellitus-Follow CBGs. 5 chronic stage III renal insufficiency-Check renal function on Thursday. Pt to fu with Dr Percival Spanish following DC. Kirk Ruths 01/01/2015, 9:10 AM

## 2015-01-01 NOTE — Discharge Summary (Signed)
Discharge Summary   Patient ID: Alexandra Gross,  MRN: [id number], DOB/AGE: November 23, 1931 81 y.o.  Admit date: 12/30/2014 Discharge date: 01/01/2015  Primary Care Provider: Cathlean Cower Primary Cardiologist: J. Hochrein, MD   Discharge Diagnoses    Principal Problem:   NSTEMI (non-ST elevated myocardial infarction) ([place])  **S/P catheterization this admission revealing complex disease involving the LCX and OM1  initial trial of medical therapy recommended.  Active Problems:   CAD (coronary artery disease)   Obesity   HTN (hypertension)   CKD (chronic kidney disease), stage III   Hyperlipidemia  Allergies Lisinopril Penicillin  Diagnostic Studies/Procedures    Cardiac Catheterization 11.7.2016  Coronary Findings     Dominance: Right    Left Main  Vessel was injected. Vessel is normal in caliber. Vessel is angiographically normal.      Left Anterior Descending  Vessel was injected. Vessel is normal in caliber. The vessel exhibits minimal luminal irregularities. The vessel is tortuous.      Left Circumflex  Vessel was injected. Vessel is large. The vessel is tortuous.   . Prox Cx lesion, 90% stenosed. discrete located at the major branch.   . Mid Cx to Dist Cx lesion, 70% stenosed. diffuse.   . First Obtuse Marginal Branch   . 1st Mrg lesion, 95% stenosed. discrete. ostial   . Second Obtuse Marginal Branch   The vessel is small in size.      Right Coronary Artery  Vessel was injected. Vessel is normal in caliber. The vessel is tortuous.   . Prox RCA lesion, 50% stenosed.   . Mid RCA to Dist RCA lesion, 50% stenosed.    _____________   History of Present Illness  81 year old female with a prior history of hypertension, diabetes, and hyperlipidemia. She also has a prior history of nonobstructive CAD found during catheterization in 2012.she was in her usual state of health until the morning of November 6, which developed substernal chest discomfort while at church. Due to  persistent symptoms, she left church and initially went home and then presented to the emergency department at [facility]. There, ECG showed nonspecific changes while point of care troponin was mildly elevated at 0.1. She was seen by cardiology and admitted for further evaluation.  Hospital Course   Following admission, Alexandra Gross had no further chest discomfort. Her troponin peaked at 0.07. She was maintained on aspirin, statin, and heparin therapy. Beta blocker was not used secondary to baseline bradycardia. She underwent diagnostic cardiac catheterization on November 7, revealing severe, complex proximal left circumflex disease that involved the ostial first obtuse marginal. This complex stenosis was felt to be high risk for PCI and following review with the interventional team and the patient, it was determined that she would be best served with an initial trial of medical therapy. She was maintained on calcium channel blocker therapy and aspirin, Plavix, and nitrate therapy were added. Again, due to bradycardia, we were unable to add a beta blocker. Post catheterization, Alexandra Gross has been ambulating without recurrent chest pain or dyspnea. Her creatinine is stable at 1.45. Alexandra Gross will be discharged home this afternoon in good condition and we have arranged for follow-up in one week.we will plan to repeat a basic metabolic profile at that time and we will also arrange for 2-D echocardiogram to evaluate her LV function as ventriculography was not performed during this hospitalization secondary to chronic kidney disease.  Consultants: none  _____________  Discharge Vitals Blood pressure 137/60, pulse 72, temperature  98.5 F (36.9 C), temperature source Oral, resp. rate 18, weight 195 lb 8 oz (88.678 kg), SpO2 94 %.  Filed Weights   12/30/14 1440 12/31/14 0452 01/01/15 0500  Weight: 201 lb 2 oz (91.23 kg) 196 lb 1.6 oz (88.95 kg) 195 lb 8 oz (88.678 kg)   _____________  Labs      CBC  Recent Labs  12/31/14 0059 01/01/15 0314  WBC 9.2 8.9  HGB 11.5* 10.8*  HCT 34.5* 33.7*  MCV 87.1 87.1  PLT 233 119   Basic Metabolic Panel  Recent Labs  12/31/14 0059 01/01/15 0314  NA 149* 141  K 4.2 4.4  CL 99* 107  CO2 18* 26  GLUCOSE 102* 132*  BUN 21* 19  CREATININE 1.25* 1.45*  CALCIUM 6.9* 8.8*   Liver Function Tests  Recent Labs  12/30/14 1800  AST 33  ALT 30  ALKPHOS 67  BILITOT 0.8  PROT 7.9  ALBUMIN 3.6   Cardiac Enzymes  Recent Labs  12/30/14 1800 12/31/14 0059 12/31/14 0608  TROPONINI 0.07* 0.07* 0.06*   Hemoglobin A1C  Recent Labs  12/30/14 1445  HGBA1C 7.2*   Fasting Lipid Panel  Recent Labs  12/31/14 0059  CHOL 117  HDL 40*  LDLCALC 63  TRIG 70  CHOLHDL 2.9   Thyroid Function Tests  Recent Labs  12/30/14 1900  TSH 2.850   _____________  Disposition   Pt is being discharged home today in good condition. _____________  Follow-up Plans & Appointments    Follow-up Information    Follow up with Richardson Dopp, PA-C On 01/08/2015.   Specialties:  Physician Assistant, Radiology, Interventional Cardiology   Why:  10:00 - Dr. Rosezella Florida PA   Contact information:   [address] [place] [place] [address] [phone]       Follow up with Cathlean Cower, MD.   Specialties:  Internal Medicine, Radiology   Why:  as scheduled.   Contact information:   [place] [address] [phone]       Discharge Medications   Current Discharge Medication List    START taking these medications   Details  clopidogrel (PLAVIX) 75 MG tablet Take 1 tablet (75 mg total) by mouth daily. Qty: 30 tablet, Refills: 6    isosorbide mononitrate (IMDUR) 60 MG 24 hr tablet Take 1 tablet (60 mg total) by mouth daily. Qty: 30 tablet, Refills: 6      CONTINUE these medications which have CHANGED   Details  atorvastatin (LIPITOR) 80 MG tablet Take 1 tablet (80 mg total) by mouth daily at 6  PM. Qty: 30 tablet, Refills: 6    metFORMIN (GLUCOPHAGE) 500 MG tablet Take 1 tablet (500 mg total) by mouth 2 (two) times daily. Qty: 60 tablet, Refills: 5   **TO BE RESUMED ON 01/02/2015.      nitroGLYCERIN (NITROSTAT) 0.4 MG SL tablet Place 1 tablet (0.4 mg total) under the tongue every 5 (five) minutes as needed for chest pain. Qty: 25 tablet, Refills: 3      CONTINUE these medications which have NOT CHANGED   Details  amLODipine (NORVASC) 10 MG tablet TAKE 1 TABLET BY MOUTH EVERY DAY Qty: 90 tablet, Refills: 3    aspirin 81 MG tablet Take 81 mg by mouth daily.    COD LIVER OIL PO Take 1 capsule by mouth daily.      valsartan (DIOVAN) 80 MG tablet TAKE 1 TABLET BY MOUTH EVERY DAY Qty: 90  tablet, Refills: 1    vitamin B-12 (CYANOCOBALAMIN) 500 MCG tablet Take 500 mcg by mouth daily.    VITAMIN E PO Take 1 capsule by mouth daily.        STOP taking these medications     hydrochlorothiazide (HYDRODIURIL) 25 MG tablet          Aspirin prescribed at discharge:  Yes High Intensity Statin Prescribed? (Lipitor 40-80mg  or Crestor 20-40mg ): Yes Beta Blocker Prescribed: Yes ADP Receptor Inhibitor Prescribed? (i.e. Plavix etc.-Includes Medically Managed Patients): Yes For EF <40%, Aldosterone Inhibitor Prescribed? No: N/A Was EF assessed during THIS hospitalization? No: f/u echo planned for outpatient setting. Was Cardiac Rehab II ordered? (Included Medically managed Patients): Yes _____________   Outstanding Labs/Studies   Follow-up BMET at return office visit. Follow-up echo as an outpatient.  Duration of Discharge Encounter   Greater than 30 minutes including physician time.  Signed, Murray Hodgkins NP 01/01/2015, 1:00 PM

## 2015-01-02 ENCOUNTER — Telehealth: Payer: Self-pay | Admitting: *Deleted

## 2015-01-02 LAB — GLUCOSE, CAPILLARY: GLUCOSE-CAPILLARY: 116 mg/dL — AB (ref 65–99)

## 2015-01-02 NOTE — Telephone Encounter (Signed)
Unable to leave message no vm set up

## 2015-01-02 NOTE — Telephone Encounter (Signed)
Pt was on TCM list D/c 01/01/15 had cardiac catherization will f/u with cardiology Dr. Kathlen Mody...Johny Chess

## 2015-01-04 NOTE — Telephone Encounter (Signed)
Patient contacted regarding discharge from [facility] on 01/01/15.  Patient understands to follow up with provider Scott on 01-08-15  At 10am at [facility] [address]. Patient understands discharge instructions? yes Patient understands medications and regiment? yes Patient understands to bring all medications to this visit? yes

## 2015-01-07 NOTE — Progress Notes (Signed)
Cardiology Office Note   Date:  01/08/2015   ID:  GRACELEE ROMBACH, DOB Jun 02, 1931, MRN [id number]   Patient Care Team: Biagio Borg, MD as PCP - General (Internal Medicine) Minus Breeding, MD as Consulting Physician (Cardiology)    Chief Complaint  Patient presents with  . Hospitalization Follow-up    s/p NSTEMI >> Med Rx  . Coronary Artery Disease     History of Present Illness: JEYDI RAMPTON is a 81 y.o. female with a hx of HTN, HL, DM2, CKD.  LHC in 2012 had demonstrated non-obstructive CAD.    Admitted 11/6-11/8 with a NSTEMI.  Troponin levels were minimally elevated. LHC demonstrated severe single vessel obstructive CAD with severe stenosis of the proximal LCx involving the takeoff of a large branching OM with an acute angulated bend following the lesion. PCI was felt to be high risk due to morphology, tortuosity and involvement of a major side branch. It was felt that medical therapy should be attempted first. She was not placed on beta blocker secondary to bradycardia. Alsen channel blocker, aspirin, Plavix and nitrates were initiated.  She returns for follow-up.  She is doing well.  Overall feels much better.  Notes less shortness of breath.  She has had 2 episodes of chest pain requiring NTG x 1 with relief.  She denies exertional chest pain.  She denies orthopnea, PND, edema.  She denies syncope.     Studies/Reports Reviewed Today:  LHC 12/31/14 LM ok LAD: luminal irregs LCx: prox 90%, mid 70%, OM1 95% RCA:  prox 50%, mid 50%  1. Severe single vessel obstructive CAD. There is a severe stenosis of the proximal LCx that involves the take off of a large branching OM. Following the lesion there is an acute angulated bend in the vessel.   Plan: the Patient has a complex stenosis in the proximal LCx. From a PCI standpoint it is a high risk lesion due to morphology, tortuosity, and involvement of a major side branch. Will discuss potential options for treatment to include medical  therapy versus high risk PCI.       Past Medical History  Diagnosis Date  . Hypertension   . Diabetes mellitus   . Obesity   . Arthritis   . Hyperlipidemia   . B12 deficiency   . CAD (coronary artery disease)     a. 12/2010 Cath: nonobs dzs;  b. 12/2014 NSTEMI/Cath: LM nl, LAD nl, LCX 90p, 41m, OM1 95ost, OM2 small, RCA 50p/d-->PCI of complex LCX/OM lesion considered, felt to be high risk-->Med Rx.    Past Surgical History  Procedure Laterality Date  . Appendectomy    . Tumor removed      Abdomen (benign)  . Back surgery    . Abdominal hysterectomy  1972  . Left heart catheterization with coronary angiogram N/A 01/06/2011    Procedure: LEFT HEART CATHETERIZATION WITH CORONARY ANGIOGRAM;  Surgeon: Minus Breeding, MD;  Location: [facility] CATH LAB;  Service: Cardiovascular;  Laterality: N/A;  . Cardiac catheterization N/A 12/31/2014    Procedure: Left Heart Cath and Coronary Angiography;  Surgeon: Peter M Martinique, MD;  Location: [facility] CV LAB;  Service: Cardiovascular;  Laterality: N/A;     Current Outpatient Prescriptions  Medication Sig Dispense Refill  . amLODipine (NORVASC) 10 MG tablet TAKE 1 TABLET BY MOUTH EVERY DAY 90 tablet 3  . aspirin 81 MG tablet Take 81 mg by mouth daily.    Marland Kitchen atorvastatin (LIPITOR) 80 MG tablet Take 1 tablet (  80 mg total) by mouth daily at 6 PM. 30 tablet 6  . clopidogrel (PLAVIX) 75 MG tablet Take 1 tablet (75 mg total) by mouth daily. 30 tablet 6  . [employer] B-12 500 MCG SUBL TAKE 1 TABLET (500 MCG TOTAL) BY MOUTH DAILY.  1  . hydrochlorothiazide (HYDRODIURIL) 25 MG tablet TAKE 1 TABLET BY MOUTH EVERY DAY AS NEEDED FOR SWELLING  3  . metFORMIN (GLUCOPHAGE) 500 MG tablet Take 1 tablet (500 mg total) by mouth 2 (two) times daily. 60 tablet 1  . nitroGLYCERIN (NITROSTAT) 0.4 MG SL tablet Place 1 tablet (0.4 mg total) under the tongue every 5 (five) minutes as needed for chest pain. 25 tablet 3  . Omega-3 Fatty Acids (FISH OIL) 1000 MG CAPS Take 1 capsule  by mouth daily.    . valsartan (DIOVAN) 80 MG tablet Take 1 tablet (80 mg total) by mouth daily. 30 tablet 1  . vitamin B-12 (CYANOCOBALAMIN) 500 MCG tablet Take 1 tablet (500 mcg total) by mouth daily. 30 tablet 1  . VITAMIN E PO Take 1 capsule by mouth daily.      . isosorbide mononitrate (IMDUR) 60 MG 24 hr tablet Take 1.5 tablets (90 mg total) by mouth daily. 135 tablet 3   No current facility-administered medications for this visit.    Allergies:   Lisinopril and Penicillins    Social History:   Social History   Social History  . Marital Status: Widowed    Spouse Name: N/A  . Number of Children: 1  . Years of Education: N/A   Occupational History  . Retired    Social History Main Topics  . Smoking status: Former Smoker    Types: Cigarettes    Quit date: 02/23/1993  . Smokeless tobacco: Never Used  . Alcohol Use: No  . Drug Use: No  . Sexual Activity: Not Asked   Other Topics Concern  . None   Social History Narrative   Lives alone.  2 grandchildren.  1 great grand.     Family History:   Family History  Problem Relation Age of Onset  . Diabetes Mother   . Arthritis Mother   . Hypertension Mother       ROS:   Please see the history of present illness.   Review of Systems  Constitution: Positive for decreased appetite and malaise/fatigue.  Cardiovascular: Positive for chest pain, dyspnea on exertion and leg swelling.  Hematologic/Lymphatic: Bruises/bleeds easily.  Musculoskeletal: Positive for back pain and myalgias.  Gastrointestinal: Positive for diarrhea.  All other systems reviewed and are negative.     PHYSICAL EXAM: VS:  BP 148/60 mmHg  Pulse 77  Ht 5\' 3"  (1.6 m)  Wt 198 lb 3.2 oz (89.903 kg)  BMI 35.12 kg/m2    Wt Readings from Last 3 Encounters:  01/08/15 198 lb 3.2 oz (89.903 kg)  01/01/15 195 lb 8 oz (88.678 kg)  12/21/14 201 lb (91.173 kg)     GEN: Well nourished, well developed, in no acute distress HEENT: normal Neck: no JVD,    no masses Cardiac:  Normal S1/S2, RRR; no murmur ,  no rubs or gallops, no edema ; right wrist without hematoma or mass  Respiratory:  clear to auscultation bilaterally, no wheezing, rhonchi or rales. GI: soft, nontender, nondistended, + BS MS: no deformity or atrophy Skin: warm and dry  Neuro:  CNs II-XII intact, Strength and sensation are intact Psych: Normal affect   EKG:  EKG is ordered today.  It demonstrates:   NSR HR 77, normal axis, NSSTTW changes, QTc 441 ms, no change from prior tracing.   Recent Labs: 12/30/2014: ALT 30; B Natriuretic Peptide 145.8*; TSH 2.850 01/01/2015: Hemoglobin 10.8*; Platelets 228 01/08/2015: BUN 23; Creat 1.56*; Potassium 4.3; Sodium 139    Lipid Panel    Component Value Date/Time   CHOL 117 12/31/2014 0059   TRIG 70 12/31/2014 0059   HDL 40* 12/31/2014 0059   CHOLHDL 2.9 12/31/2014 0059   VLDL 14 12/31/2014 0059   LDLCALC 63 12/31/2014 0059   LDLDIRECT 66.0 12/19/2014 1620      ASSESSMENT AND PLAN:  1. CAD:  S/p NSTEMI with LHC demonstrating severe stenosis in the proximal LCX involving the takeoff of a large branching OM.  PCI is felt to be too high risk and med rx has been recommended.  If she has refractory angina, high risk PCI can be considered.  She is doing well. She has had 2 episodes of chest pain but overall with improved symptoms.  Continue Amlodipine, nitrates, statin, ASA, Plavix.  She was not placed on beta-blocker due to bradycardia.  HR looks better today.  If HR continues to remain > 70, can consider adding low dose Metoprolol.    -  Increase Imdur to 90 mg QD  -  Refer to Cardiac Rehab at [facility]  -  Arrange Echo  2. HTN:  BP above target.  Increase Nitrates as noted.    3. Hyperlipidemia:  She has been taking her old Lipitor 20 and her new rx for Lipitor 80.  DC the Lipitor 20.  Continue Lipitor 80. Check Lipids and LFTs in 6 weeks.   4. CKD:  Stage  3.  Repeat BMET.  Continue angiotensin receptor blocker.    5. Diabetes:   FU with PCP.    Medication Changes: Current medicines are reviewed at length with the patient today.  Concerns regarding medicines are as outlined above.  The following changes have been made:   Discontinued Medications   ATORVASTATIN (LIPITOR) 20 MG TABLET    Take 20 mg by mouth daily.   COD LIVER OIL PO    Take 1 capsule by mouth daily.     ISOSORBIDE MONONITRATE (IMDUR) 60 MG 24 HR TABLET    Take 1 tablet (60 mg total) by mouth daily.   NITROGLYCERIN (NITROSTAT) 0.4 MG SL TABLET    Place 1 tablet (0.4 mg total) under the tongue every 5 (five) minutes as needed for chest pain (CP or SOB).   Modified Medications   No medications on file   New Prescriptions   ISOSORBIDE MONONITRATE (IMDUR) 60 MG 24 HR TABLET    Take 1.5 tablets (90 mg total) by mouth daily.   Labs/ tests ordered today include:   Orders Placed This Encounter  Procedures  . Basic Metabolic Panel (BMET)  . Hepatic function panel  . Lipid Profile  . EKG 12-Lead  . Echocardiogram     Disposition:    FU with Dr. Minus Breeding 1 month.      Signed, Versie Starks, MHS 01/08/2015 9:41 PM    [facility] [place], [place], [place]  [address] Phone: [phone]; Fax: [phone]

## 2015-01-08 ENCOUNTER — Encounter: Payer: Self-pay | Admitting: Physician Assistant

## 2015-01-08 ENCOUNTER — Ambulatory Visit (INDEPENDENT_AMBULATORY_CARE_PROVIDER_SITE_OTHER): Payer: Medicare Other | Admitting: Physician Assistant

## 2015-01-08 VITALS — BP 148/60 | HR 77 | Ht 63.0 in | Wt 198.2 lb

## 2015-01-08 DIAGNOSIS — I25119 Atherosclerotic heart disease of native coronary artery with unspecified angina pectoris: Secondary | ICD-10-CM

## 2015-01-08 DIAGNOSIS — N183 Chronic kidney disease, stage 3 (moderate): Secondary | ICD-10-CM

## 2015-01-08 DIAGNOSIS — E785 Hyperlipidemia, unspecified: Secondary | ICD-10-CM | POA: Diagnosis not present

## 2015-01-08 DIAGNOSIS — I252 Old myocardial infarction: Secondary | ICD-10-CM

## 2015-01-08 DIAGNOSIS — I10 Essential (primary) hypertension: Secondary | ICD-10-CM

## 2015-01-08 DIAGNOSIS — E1159 Type 2 diabetes mellitus with other circulatory complications: Secondary | ICD-10-CM

## 2015-01-08 LAB — BASIC METABOLIC PANEL
BUN: 23 mg/dL (ref 7–25)
CALCIUM: 9.8 mg/dL (ref 8.6–10.4)
CO2: 24 mmol/L (ref 20–31)
CREATININE: 1.56 mg/dL — AB (ref 0.60–0.88)
Chloride: 106 mmol/L (ref 98–110)
Glucose, Bld: 146 mg/dL — ABNORMAL HIGH (ref 65–99)
Potassium: 4.3 mmol/L (ref 3.5–5.3)
Sodium: 139 mmol/L (ref 135–146)

## 2015-01-08 MED ORDER — ISOSORBIDE MONONITRATE ER 60 MG PO TB24: 90.0000 mg | ORAL_TABLET | Freq: Every day | ORAL | Status: DC

## 2015-01-08 NOTE — Patient Instructions (Addendum)
Medication Instructions:  1) STOP taking your Lipitor 20 mg 2) CONTINUE Lipitor 80mg  once daily 3) INCREASE Imdur to 90mg  once daily  Labwork: BMET today  Your physician recommends that you return for lab work in: 4-6 weeks (Lipids, LFTs)   Testing/Procedures: Your physician has requested that you have an echocardiogram. Echocardiography is a painless test that uses sound waves to create images of your heart. It provides your doctor with information about the size and shape of your heart and how well your heart's chambers and valves are working. This procedure takes approximately one hour. There are no restrictions for this procedure.   Follow-Up: Your physician recommends that you schedule a follow-up appointment in: 1 month with Dr. Percival Spanish.   Any Other Special Instructions Will Be Listed Below (If Applicable).  You have been referred to Cardiac Rehab at [facility]. They will call you to schedule.  This could take about 2 weeks.  Cardiac Rehab phone number is [phone].   If you need a refill on your cardiac medications before your next appointment, please call your pharmacy.

## 2015-01-09 ENCOUNTER — Other Ambulatory Visit: Payer: Self-pay | Admitting: *Deleted

## 2015-01-09 DIAGNOSIS — R7989 Other specified abnormal findings of blood chemistry: Secondary | ICD-10-CM

## 2015-01-16 ENCOUNTER — Other Ambulatory Visit: Payer: Self-pay | Admitting: Internal Medicine

## 2015-01-16 ENCOUNTER — Ambulatory Visit (HOSPITAL_COMMUNITY): Payer: Medicare Other | Attending: Cardiology

## 2015-01-16 ENCOUNTER — Other Ambulatory Visit: Payer: Self-pay

## 2015-01-16 DIAGNOSIS — I252 Old myocardial infarction: Secondary | ICD-10-CM | POA: Diagnosis not present

## 2015-01-16 DIAGNOSIS — I10 Essential (primary) hypertension: Secondary | ICD-10-CM | POA: Insufficient documentation

## 2015-01-16 DIAGNOSIS — Z6835 Body mass index (BMI) 35.0-35.9, adult: Secondary | ICD-10-CM | POA: Diagnosis not present

## 2015-01-16 DIAGNOSIS — E785 Hyperlipidemia, unspecified: Secondary | ICD-10-CM | POA: Diagnosis not present

## 2015-01-16 DIAGNOSIS — E669 Obesity, unspecified: Secondary | ICD-10-CM | POA: Diagnosis not present

## 2015-01-16 DIAGNOSIS — I517 Cardiomegaly: Secondary | ICD-10-CM | POA: Insufficient documentation

## 2015-01-16 DIAGNOSIS — I059 Rheumatic mitral valve disease, unspecified: Secondary | ICD-10-CM | POA: Insufficient documentation

## 2015-01-16 DIAGNOSIS — E119 Type 2 diabetes mellitus without complications: Secondary | ICD-10-CM | POA: Diagnosis not present

## 2015-01-20 ENCOUNTER — Encounter: Payer: Self-pay | Admitting: Physician Assistant

## 2015-01-20 ENCOUNTER — Other Ambulatory Visit: Payer: Self-pay | Admitting: Internal Medicine

## 2015-01-21 ENCOUNTER — Other Ambulatory Visit: Payer: Medicare Other

## 2015-01-21 ENCOUNTER — Telehealth: Payer: Self-pay | Admitting: *Deleted

## 2015-01-21 NOTE — Telephone Encounter (Signed)
Pt has been notified of echo results. When I was going over upcoming appts for pt. I noticed she was supposed to have lab work today, pt said she did not know that. Pt states she can't remember like she used too. Pt will have lab 11/29.

## 2015-01-22 ENCOUNTER — Other Ambulatory Visit (INDEPENDENT_AMBULATORY_CARE_PROVIDER_SITE_OTHER): Payer: Medicare Other | Admitting: *Deleted

## 2015-01-22 DIAGNOSIS — R748 Abnormal levels of other serum enzymes: Secondary | ICD-10-CM | POA: Diagnosis not present

## 2015-01-22 DIAGNOSIS — R7989 Other specified abnormal findings of blood chemistry: Secondary | ICD-10-CM

## 2015-01-22 LAB — BASIC METABOLIC PANEL
BUN: 44 mg/dL — ABNORMAL HIGH (ref 7–25)
CHLORIDE: 107 mmol/L (ref 98–110)
CO2: 22 mmol/L (ref 20–31)
CREATININE: 1.73 mg/dL — AB (ref 0.60–0.88)
Calcium: 9.3 mg/dL (ref 8.6–10.4)
Glucose, Bld: 135 mg/dL — ABNORMAL HIGH (ref 65–99)
POTASSIUM: 4.6 mmol/L (ref 3.5–5.3)
Sodium: 138 mmol/L (ref 135–146)

## 2015-01-23 ENCOUNTER — Telehealth: Payer: Self-pay

## 2015-01-23 DIAGNOSIS — I10 Essential (primary) hypertension: Secondary | ICD-10-CM

## 2015-01-23 NOTE — Telephone Encounter (Signed)
Informed patient of results and verbal understanding expressed.  Patient reports taking HCTZ daily. Instructed patient to STOP HCTZ and repeat BMET in a week. Patient unable to come, but st she has a ride 12/15 for her lab work then. BMET added to 12/15 appointment. Patient agrees with treatment plan.

## 2015-01-23 NOTE — Telephone Encounter (Signed)
-----   Message from Liliane Shi, [place] sent at 01/22/2015  5:04 PM EST ----- BUN and Creatinine higher. Please find out if she has been taking HCTZ on a daily basis. If taking HCTZ daily >> stop and repeat BMET in 1 week. If she takes HCTZ infrequently >> stop Diovan and start Hydralazine 25 mg Three times a day and repeat BMET in 1 week. Richardson Dopp, PA-C   01/22/2015 5:04 PM

## 2015-01-26 ENCOUNTER — Other Ambulatory Visit: Payer: Self-pay | Admitting: Internal Medicine

## 2015-01-28 ENCOUNTER — Other Ambulatory Visit: Payer: Self-pay | Admitting: *Deleted

## 2015-01-28 MED ORDER — AMLODIPINE BESYLATE 10 MG PO TABS: ORAL_TABLET | ORAL | Status: DC

## 2015-02-05 ENCOUNTER — Other Ambulatory Visit: Payer: Medicare Other

## 2015-02-07 ENCOUNTER — Other Ambulatory Visit (INDEPENDENT_AMBULATORY_CARE_PROVIDER_SITE_OTHER): Payer: Medicaid Other

## 2015-02-07 ENCOUNTER — Ambulatory Visit (INDEPENDENT_AMBULATORY_CARE_PROVIDER_SITE_OTHER): Payer: Medicare Other | Admitting: Cardiology

## 2015-02-07 ENCOUNTER — Encounter: Payer: Self-pay | Admitting: Cardiology

## 2015-02-07 ENCOUNTER — Other Ambulatory Visit: Payer: Self-pay | Admitting: Physician Assistant

## 2015-02-07 VITALS — BP 150/56 | HR 69 | Ht 62.0 in | Wt 192.4 lb

## 2015-02-07 DIAGNOSIS — I10 Essential (primary) hypertension: Secondary | ICD-10-CM

## 2015-02-07 DIAGNOSIS — I200 Unstable angina: Secondary | ICD-10-CM | POA: Diagnosis not present

## 2015-02-07 DIAGNOSIS — E785 Hyperlipidemia, unspecified: Secondary | ICD-10-CM

## 2015-02-07 DIAGNOSIS — I214 Non-ST elevation (NSTEMI) myocardial infarction: Secondary | ICD-10-CM

## 2015-02-07 LAB — HEPATIC FUNCTION PANEL
ALBUMIN: 3.7 g/dL (ref 3.6–5.1)
ALK PHOS: 74 U/L (ref 33–130)
ALT: 56 U/L — ABNORMAL HIGH (ref 6–29)
AST: 56 U/L — ABNORMAL HIGH (ref 10–35)
BILIRUBIN TOTAL: 0.6 mg/dL (ref 0.2–1.2)
Bilirubin, Direct: 0.1 mg/dL (ref <=0.2)
Indirect Bilirubin: 0.5 mg/dL (ref 0.2–1.2)
Total Protein: 7.4 g/dL (ref 6.1–8.1)

## 2015-02-07 LAB — BASIC METABOLIC PANEL
BUN: 29 mg/dL — ABNORMAL HIGH (ref 7–25)
CO2: 20 mmol/L (ref 20–31)
Calcium: 9.2 mg/dL (ref 8.6–10.4)
Chloride: 106 mmol/L (ref 98–110)
Creat: 1.6 mg/dL — ABNORMAL HIGH (ref 0.60–0.88)
GLUCOSE: 96 mg/dL (ref 65–99)
POTASSIUM: 4.8 mmol/L (ref 3.5–5.3)
SODIUM: 139 mmol/L (ref 135–146)

## 2015-02-07 LAB — LIPID PANEL
CHOL/HDL RATIO: 2.5 ratio (ref <=5.0)
Cholesterol: 123 mg/dL — ABNORMAL LOW (ref 125–200)
HDL: 50 mg/dL (ref >=46)
LDL CALC: 50 mg/dL (ref <130)
Triglycerides: 117 mg/dL (ref <150)
VLDL: 23 mg/dL (ref <30)

## 2015-02-07 NOTE — Addendum Note (Signed)
Addended by: Eulis Foster on: 02/07/2015 09:58 AM   Modules accepted: Orders

## 2015-02-07 NOTE — Progress Notes (Signed)
Cardiology Office Note   Date:  02/07/2015   ID:  Alexandra Gross, DOB Aug 10, 1931, MRN [id number]  PCP:  Cathlean Cower, MD  Cardiologist:   Minus Breeding, MD   Chief Complaint  Patient presents with  . Coronary Artery Disease      History of Present Illness: Alexandra Gross is a 81 y.o. female who presents for  For follow-up of NSTEMI.   The patient does not have single-vessel CAD as described. However, because this was high risk she was managed medically.   At the last visit she did have her Imdur increased. I reviewed that office note and her hospital records since I have not seen her in a while. She's been doing well. She's been doing some walking at home.  She has been using Therabands and walking on a treadmill.The patient denies any cardiovascular symptoms. She's not having chest pressure, neck or arm discomfort. She's not having any palpitations, presyncope or syncope. She has no PND or orthopnea.   Past Medical History  Diagnosis Date  . Hypertension   . Diabetes mellitus   . Obesity   . Arthritis   . Hyperlipidemia   . B12 deficiency   . CAD (coronary artery disease)     a. 12/2010 Cath: nonobs dzs;  b. 12/2014 NSTEMI/Cath: LM nl, LAD nl, LCX 90p, 86m, OM1 95ost, OM2 small, RCA 50p/d-->PCI of complex LCX/OM lesion considered, felt to be high risk-->Med Rx.  Marland Kitchen History of echocardiogram     a.  Echo 11/16: mild LVH, EF 55%, no RWMA, Gr 1 DD, MAC, mild LAE, normal RVF    Past Surgical History  Procedure Laterality Date  . Appendectomy    . Tumor removed      Abdomen (benign)  . Back surgery    . Abdominal hysterectomy  1972  . Left heart catheterization with coronary angiogram N/A 01/06/2011    Procedure: LEFT HEART CATHETERIZATION WITH CORONARY ANGIOGRAM;  Surgeon: Minus Breeding, MD;  Location: [facility] CATH LAB;  Service: Cardiovascular;  Laterality: N/A;  . Cardiac catheterization N/A 12/31/2014    Procedure: Left Heart Cath and Coronary Angiography;  Surgeon: Peter M Martinique,  MD;  Location: [place] CV LAB;  Service: Cardiovascular;  Laterality: N/A;     Current Outpatient Prescriptions  Medication Sig Dispense Refill  . amLODipine (NORVASC) 10 MG tablet TAKE 1 TABLET BY MOUTH EVERY DAY 90 tablet 3  . aspirin 81 MG tablet Take 81 mg by mouth daily.    Marland Kitchen atorvastatin (LIPITOR) 80 MG tablet Take 1 tablet (80 mg total) by mouth daily at 6 PM. 30 tablet 6  . clopidogrel (PLAVIX) 75 MG tablet Take 1 tablet (75 mg total) by mouth daily. 30 tablet 6  . [facility] B-12 500 MCG SUBL TAKE 1 TABLET (500 MCG TOTAL) BY MOUTH DAILY.  1  . isosorbide mononitrate (IMDUR) 60 MG 24 hr tablet Take 1.5 tablets (90 mg total) by mouth daily. 135 tablet 3  . metFORMIN (GLUCOPHAGE) 500 MG tablet Take 1 tablet (500 mg total) by mouth 2 (two) times daily. 60 tablet 1  . nitroGLYCERIN (NITROSTAT) 0.4 MG SL tablet Place 1 tablet (0.4 mg total) under the tongue every 5 (five) minutes as needed for chest pain. 25 tablet 3  . Omega-3 Fatty Acids (FISH OIL) 1000 MG CAPS Take 1 capsule by mouth daily.    . valsartan (DIOVAN) 80 MG tablet Take 1 tablet (80 mg total) by mouth daily. 30 tablet 1  .  vitamin B-12 (CYANOCOBALAMIN) 500 MCG tablet Take 1 tablet (500 mcg total) by mouth daily. 30 tablet 1  . VITAMIN E PO Take 1 capsule by mouth daily.       No current facility-administered medications for this visit.    Allergies:   Lisinopril and Penicillins    ROS:  Please see the history of present illness.   Otherwise, review of systems are positive for none.   All other systems are reviewed and negative.    PHYSICAL EXAM: VS:  BP 150/56 mmHg  Pulse 69  Ht 5\' 2"  (1.575 m)  Wt 192 lb 6.4 oz (87.272 kg)  BMI 35.18 kg/m2 , BMI Body mass index is 35.18 kg/(m^2). GENERAL:  Well appearing NECK:  No jugular venous distention, waveform within normal limits, carotid upstroke brisk and symmetric, no bruits, no thyromegaly LYMPHATICS:  No cervical, inguinal adenopathy LUNGS:  Clear to auscultation  bilaterally BACK:  No CVA tenderness CHEST:  Unremarkable HEART:  PMI not displaced or sustained,S1 and S2 within normal limits, no S3, no S4, no clicks, no rubs, no murmurs ABD:  Flat, positive bowel sounds normal in frequency in pitch, no bruits, no rebound, no guarding, no midline pulsatile mass, no hepatomegaly, no splenomegaly EXT:  2 plus pulses throughout, no edema, no cyanosis no clubbing    EKG:  EKG is not ordered today.   Recent Labs: 12/30/2014: ALT 30; B Natriuretic Peptide 145.8*; TSH 2.850 01/01/2015: Hemoglobin 10.8*; Platelets 228 01/22/2015: BUN 44*; Creat 1.73*; Potassium 4.6; Sodium 138    Lipid Panel    Component Value Date/Time   CHOL 117 12/31/2014 0059   TRIG 70 12/31/2014 0059   HDL 40* 12/31/2014 0059   CHOLHDL 2.9 12/31/2014 0059   VLDL 14 12/31/2014 0059   LDLCALC 63 12/31/2014 0059   LDLDIRECT 66.0 12/19/2014 1620      Wt Readings from Last 3 Encounters:  02/07/15 192 lb 6.4 oz (87.272 kg)  01/08/15 198 lb 3.2 oz (89.903 kg)  01/01/15 195 lb 8 oz (88.678 kg)      Other studies Reviewed: Additional studies/ records that were reviewed today include: Hospital records. Review of the above records demonstrates:  Please see elsewhere in the note.     ASSESSMENT AND PLAN:   CAD:  The patient is doing well not having further symptoms. This point no change in therapy is indicated. She will continue with risk reduction.  HTN: BP  Is slightly elevated. However, she does not take any more medications. Therefore, she will remain on meds as listed.  Hyperlipidemia:  She had her dose of Lipitor increased recently I'll check liver enzymes and a lipid profile today.   CKD: Stage 3.  I will repeat a basic metabolic profile today.  Diabetes: FU with PCP.  Current medicines are reviewed at length with the patient today.  The patient does not have concerns regarding medicines.  The following changes have been made:  no change  Labs/ tests  ordered today include:  Labs as above.     Disposition:   FU with me in six months.      Signed, Minus Breeding, MD  02/07/2015 5:41 PM    [place]

## 2015-02-07 NOTE — Patient Instructions (Signed)
Please have your blood work done today!  Dr Percival Spanish recommends that you schedule a follow-up appointment in 6 months. You will receive a reminder letter in the mail two months in advance. If you don't receive a letter, please call our office to schedule the follow-up appointment.  If you need a refill on your cardiac medications before your next appointment, please call your pharmacy.

## 2015-02-08 ENCOUNTER — Telehealth: Payer: Self-pay

## 2015-02-08 DIAGNOSIS — N183 Chronic kidney disease, stage 3 unspecified: Secondary | ICD-10-CM

## 2015-02-08 DIAGNOSIS — E785 Hyperlipidemia, unspecified: Secondary | ICD-10-CM

## 2015-02-08 NOTE — Telephone Encounter (Signed)
Lab results given to patient with comments. Order placed to have labs drawn in 2 weeks for LFT's per Richardson Dopp.

## 2015-02-11 NOTE — Telephone Encounter (Signed)
Pt notified of lab results today. I stated looks like she s/w Stanton Kidney, RN 12/16, pt said she did not remember. Pt agreeable to repeat LFT 02/22/15.

## 2015-02-11 NOTE — Telephone Encounter (Signed)
F/u ° ° ° ° °Pt returning phone call for labs. °

## 2015-02-22 ENCOUNTER — Other Ambulatory Visit (INDEPENDENT_AMBULATORY_CARE_PROVIDER_SITE_OTHER): Payer: Medicare Other | Admitting: *Deleted

## 2015-02-22 DIAGNOSIS — E785 Hyperlipidemia, unspecified: Secondary | ICD-10-CM

## 2015-02-22 LAB — HEPATIC FUNCTION PANEL
ALBUMIN: 3.8 g/dL (ref 3.6–5.1)
ALT: 31 U/L — AB (ref 6–29)
AST: 37 U/L — ABNORMAL HIGH (ref 10–35)
Alkaline Phosphatase: 57 U/L (ref 33–130)
BILIRUBIN DIRECT: 0.1 mg/dL (ref <=0.2)
Indirect Bilirubin: 0.6 mg/dL (ref 0.2–1.2)
Total Bilirubin: 0.7 mg/dL (ref 0.2–1.2)
Total Protein: 7.6 g/dL (ref 6.1–8.1)

## 2015-03-18 ENCOUNTER — Ambulatory Visit: Payer: Medicare Other | Admitting: Cardiology

## 2015-03-29 ENCOUNTER — Other Ambulatory Visit: Payer: Self-pay | Admitting: Nurse Practitioner

## 2015-03-29 NOTE — Telephone Encounter (Signed)
Receive call pt states [facility] is [facility]. Verified pharmacy inform [facility].../lmb

## 2015-04-26 ENCOUNTER — Other Ambulatory Visit: Payer: Self-pay | Admitting: Nurse Practitioner

## 2015-05-17 DIAGNOSIS — E119 Type 2 diabetes mellitus without complications: Secondary | ICD-10-CM | POA: Diagnosis not present

## 2015-06-19 ENCOUNTER — Encounter: Payer: Self-pay | Admitting: Internal Medicine

## 2015-06-19 ENCOUNTER — Other Ambulatory Visit (INDEPENDENT_AMBULATORY_CARE_PROVIDER_SITE_OTHER): Payer: Medicare Other

## 2015-06-19 DIAGNOSIS — Z0189 Encounter for other specified special examinations: Secondary | ICD-10-CM | POA: Diagnosis not present

## 2015-06-19 DIAGNOSIS — Z0000 Encounter for general adult medical examination without abnormal findings: Secondary | ICD-10-CM

## 2015-06-19 DIAGNOSIS — E119 Type 2 diabetes mellitus without complications: Secondary | ICD-10-CM | POA: Diagnosis not present

## 2015-06-19 LAB — BASIC METABOLIC PANEL
BUN: 31 mg/dL — ABNORMAL HIGH (ref 6–23)
CALCIUM: 9.9 mg/dL (ref 8.4–10.5)
CO2: 25 mEq/L (ref 19–32)
CREATININE: 1.77 mg/dL — AB (ref 0.40–1.20)
Chloride: 105 mEq/L (ref 96–112)
GFR: 35.33 mL/min — AB (ref >60.00)
Glucose, Bld: 102 mg/dL — ABNORMAL HIGH (ref 70–99)
Potassium: 4.7 mEq/L (ref 3.5–5.1)
SODIUM: 139 meq/L (ref 135–145)

## 2015-06-19 LAB — LIPID PANEL
Cholesterol: 119 mg/dL (ref 0–200)
HDL: 42.2 mg/dL (ref >39.00)
LDL CALC: 55 mg/dL (ref 0–99)
NONHDL: 76.46
Total CHOL/HDL Ratio: 3
Triglycerides: 108 mg/dL (ref 0.0–149.0)
VLDL: 21.6 mg/dL (ref 0.0–40.0)

## 2015-06-19 LAB — URINALYSIS, ROUTINE W REFLEX MICROSCOPIC
BILIRUBIN URINE: NEGATIVE
LEUKOCYTES UA: NEGATIVE
Nitrite: NEGATIVE
Specific Gravity, Urine: 1.015 (ref 1.000–1.030)
Total Protein, Urine: NEGATIVE
URINE GLUCOSE: NEGATIVE
UROBILINOGEN UA: 0.2 (ref 0.0–1.0)
pH: 5.5 (ref 5.0–8.0)

## 2015-06-19 LAB — CBC WITH DIFFERENTIAL/PLATELET
BASOS ABS: 0.1 10*3/uL (ref 0.0–0.1)
Basophils Relative: 0.5 % (ref 0.0–3.0)
EOS PCT: 1.2 % (ref 0.0–5.0)
Eosinophils Absolute: 0.1 10*3/uL (ref 0.0–0.7)
HEMATOCRIT: 34.7 % — AB (ref 36.0–46.0)
Hemoglobin: 11.4 g/dL — ABNORMAL LOW (ref 12.0–15.0)
LYMPHS ABS: 5.8 10*3/uL — AB (ref 0.7–4.0)
LYMPHS PCT: 48.8 % — AB (ref 12.0–46.0)
MCHC: 32.9 g/dL (ref 30.0–36.0)
MCV: 86.7 fl (ref 78.0–100.0)
MONOS PCT: 6.8 % (ref 3.0–12.0)
Monocytes Absolute: 0.8 10*3/uL (ref 0.1–1.0)
NEUTROS ABS: 5 10*3/uL (ref 1.4–7.7)
NEUTROS PCT: 42.7 % — AB (ref 43.0–77.0)
Platelets: 264 10*3/uL (ref 150.0–400.0)
RBC: 4 Mil/uL (ref 3.87–5.11)
RDW: 13.9 % (ref 11.5–15.5)
WBC: 11.8 10*3/uL — ABNORMAL HIGH (ref 4.0–10.5)

## 2015-06-19 LAB — MICROALBUMIN / CREATININE URINE RATIO
Creatinine,U: 191 mg/dL
MICROALB UR: 1.8 mg/dL (ref 0.0–1.9)
Microalb Creat Ratio: 0.9 mg/g (ref 0.0–30.0)

## 2015-06-19 LAB — HEMOGLOBIN A1C: Hgb A1c MFr Bld: 7.1 % — ABNORMAL HIGH (ref 4.6–6.5)

## 2015-06-19 LAB — HEPATIC FUNCTION PANEL
ALBUMIN: 4.1 g/dL (ref 3.5–5.2)
ALK PHOS: 64 U/L (ref 39–117)
ALT: 19 U/L (ref 0–35)
AST: 24 U/L (ref 0–37)
BILIRUBIN DIRECT: 0.1 mg/dL (ref 0.0–0.3)
TOTAL PROTEIN: 8.1 g/dL (ref 6.0–8.3)
Total Bilirubin: 0.6 mg/dL (ref 0.2–1.2)

## 2015-06-19 LAB — TSH: TSH: 1.73 u[IU]/mL (ref 0.35–4.50)

## 2015-06-21 ENCOUNTER — Encounter: Payer: Self-pay | Admitting: Internal Medicine

## 2015-06-21 ENCOUNTER — Ambulatory Visit (INDEPENDENT_AMBULATORY_CARE_PROVIDER_SITE_OTHER): Payer: Medicare Other | Admitting: Internal Medicine

## 2015-06-21 VITALS — BP 146/80 | HR 74 | Temp 98.3°F | Resp 20 | Wt 189.0 lb

## 2015-06-21 DIAGNOSIS — E785 Hyperlipidemia, unspecified: Secondary | ICD-10-CM

## 2015-06-21 DIAGNOSIS — Z0001 Encounter for general adult medical examination with abnormal findings: Secondary | ICD-10-CM

## 2015-06-21 DIAGNOSIS — I10 Essential (primary) hypertension: Secondary | ICD-10-CM | POA: Diagnosis not present

## 2015-06-21 DIAGNOSIS — R6889 Other general symptoms and signs: Secondary | ICD-10-CM

## 2015-06-21 DIAGNOSIS — E119 Type 2 diabetes mellitus without complications: Secondary | ICD-10-CM

## 2015-06-21 DIAGNOSIS — N183 Chronic kidney disease, stage 3 (moderate): Secondary | ICD-10-CM

## 2015-06-21 DIAGNOSIS — I214 Non-ST elevation (NSTEMI) myocardial infarction: Secondary | ICD-10-CM

## 2015-06-21 NOTE — Progress Notes (Signed)
Pre visit review using our clinic review tool, if applicable. No additional management support is needed unless otherwise documented below in the visit note. 

## 2015-06-21 NOTE — Progress Notes (Signed)
Subjective:    Patient ID: Alexandra Gross, female    DOB: [birth year], 82 y.o.   MRN: [id number]  HPI  Here for wellness and f/u;  Overall doing ok;  Pt denies Chest pain, worsening SOB, DOE, wheezing, orthopnea, PND, worsening LE edema, palpitations, dizziness or syncope.  Pt denies neurological change such as new headache, facial or extremity weakness.  Pt denies polydipsia, polyuria, or low sugar symptoms. Pt states overall good compliance with treatment and medications, good tolerability, and has been trying to follow appropriate diet.  Pt denies worsening depressive symptoms, suicidal ideation or panic. No fever, night sweats, wt loss, loss of appetite, or other constitutional symptoms.  Pt states good ability with ADL's, has low fall risk, home safety reviewed and adequate, no other significant changes in hearing or vision, and only occasionally active with exercise.  Did have NSTEMi episode nov 2016.  BP at home < 140/90 per pt.  Denies urinary symptoms such as dysuria, frequency, urgency, flank pain, hematuria or n/v, fever, chills.  Denies worsening reflux, abd pain, dysphagia, n/v, bowel change or blood. Declines tetanus. Refuses to take the plaivx due to arm bruising and risk of bleeding. Wt Readings from Last 3 Encounters:  06/21/15 189 lb (85.73 kg)  02/07/15 192 lb 6.4 oz (87.272 kg)  01/08/15 198 lb 3.2 oz (89.903 kg)    Past Medical History  Diagnosis Date  . Hypertension   . Diabetes mellitus   . Obesity   . Arthritis   . Hyperlipidemia   . B12 deficiency   . CAD (coronary artery disease)     a. 12/2010 Cath: nonobs dzs;  b. 12/2014 NSTEMI/Cath: LM nl, LAD nl, LCX 90p, 58m, OM1 95ost, OM2 small, RCA 50p/d-->PCI of complex LCX/OM lesion considered, felt to be high risk-->Med Rx.  Marland Kitchen History of echocardiogram     a.  Echo 11/16: mild LVH, EF 55%, no RWMA, Gr 1 DD, MAC, mild LAE, normal RVF   Past Surgical History  Procedure Laterality Date  . Appendectomy    . Tumor removed        Abdomen (benign)  . Back surgery    . Abdominal hysterectomy  1972  . Left heart catheterization with coronary angiogram N/A 01/06/2011    Procedure: LEFT HEART CATHETERIZATION WITH CORONARY ANGIOGRAM;  Surgeon: Minus Breeding, MD;  Location: [facility] CATH LAB;  Service: Cardiovascular;  Laterality: N/A;  . Cardiac catheterization N/A 12/31/2014    Procedure: Left Heart Cath and Coronary Angiography;  Surgeon: Peter M Martinique, MD;  Location: [place] CV LAB;  Service: Cardiovascular;  Laterality: N/A;    reports that she quit smoking about 22 years ago. Her smoking use included Cigarettes. She has never used smokeless tobacco. She reports that she does not drink alcohol or use illicit drugs. family history includes Arthritis in her mother; Diabetes in her mother; Hypertension in her mother. Allergies  Allergen Reactions  . Lisinopril Other (See Comments)    unknown  . Penicillins Other (See Comments)   Review of Systems Constitutional: Negative for increased diaphoresis, or other activity, appetite or siginficant weight change other than noted HENT: Negative for worsening hearing loss, ear pain, facial swelling, mouth sores and neck stiffness.   Eyes: Negative for other worsening pain, redness or visual disturbance.  Respiratory: Negative for choking or stridor Cardiovascular: Negative for other chest pain and palpitations.  Gastrointestinal: Negative for worsening diarrhea, blood in stool, or abdominal distention Genitourinary: Negative for hematuria, flank pain or change in  urine volume.  Musculoskeletal: Negative for myalgias or other joint complaints.  Skin: Negative for other color change and wound or drainage.  Neurological: Negative for syncope and numbness. other than noted Hematological: Negative for adenopathy. or other swelling Psychiatric/Behavioral: Negative for hallucinations, SI, self-injury, decreased concentration or other worsening agitation.      Objective:    Physical Exam BP 146/80 mmHg  Pulse 74  Temp(Src) 98.3 F (36.8 C) (Oral)  Resp 20  Wt 189 lb (85.73 kg)  SpO2 98% VS noted,  Constitutional: Pt is oriented to person, place, and time. Appears well-developed and well-nourished, in no significant distress Head: Normocephalic and atraumatic  Eyes: Conjunctivae and EOM are normal. Pupils are equal, round, and reactive to light Right Ear: External ear normal.  Left Ear: External ear normal Nose: Nose normal.  Mouth/Throat: Oropharynx is clear and moist  Neck: Normal range of motion. Neck supple. No JVD present. No tracheal deviation present or significant neck LA or mass Cardiovascular: Normal rate, regular rhythm, normal heart sounds and intact distal pulses.   Pulmonary/Chest: Effort normal and breath sounds without rales or wheezing  Abdominal: Soft. Bowel sounds are normal. NT. No HSM  Musculoskeletal: Normal range of motion. Exhibits no edema Lymphadenopathy: Has no cervical adenopathy.  Neurological: Pt is alert and oriented to person, place, and time. Pt has normal reflexes. No cranial nerve deficit. Motor grossly intact Skin: Skin is warm and dry. No rash noted or new ulcers Psychiatric:  Has normal mood and affect. Behavior is normal.     Assessment & Plan:

## 2015-06-21 NOTE — Patient Instructions (Signed)
Please continue all other medications as before, and refills have been done if requested.  Please have the pharmacy call with any other refills you may need.  Please continue your efforts at being more active, low cholesterol diet, and weight control.  You are otherwise up to date with prevention measures today.  Please keep your appointments with your specialists as you may have planned  Please return in 6 months, or sooner if needed, with Lab testing done 3-5 days before  

## 2015-06-22 NOTE — Assessment & Plan Note (Addendum)
stable overall by history and exam, recent data reviewed with pt, and pt to continue medical treatment as before,  to f/u any worsening symptoms or concerns Lab Results  Component Value Date   HGBA1C 7.1* 06/19/2015   In addition to the time spent performing CPE, I spent an additional 15 minutes face to face,in which greater than 50% of this time was spent in counseling and coordination of care for patient's illness as documented.

## 2015-06-22 NOTE — Assessment & Plan Note (Signed)
stable overall by history and exam, recent data reviewed with pt, and pt to continue medical treatment as before,  to f/u any worsening symptoms or concerns Lab Results  Component Value Date   LDLCALC 55 06/19/2015

## 2015-06-22 NOTE — Assessment & Plan Note (Signed)
Mild elev today, o/w stable overall by history and exam, recent data reviewed with pt, and pt to continue medical treatment as before - declines further med changes,  to f/u any worsening symptoms or concerns BP Readings from Last 3 Encounters:  06/21/15 146/80  02/07/15 150/56  01/08/15 148/60

## 2015-06-22 NOTE — Assessment & Plan Note (Signed)

## 2015-06-22 NOTE — Assessment & Plan Note (Signed)
stable overall by history and exam, recent data reviewed with pt, and pt to continue medical treatment as before,  to f/u any worsening symptoms or concerns Lab Results  Component Value Date   CREATININE 1.77* 06/19/2015

## 2015-06-22 NOTE — Assessment & Plan Note (Signed)
Asympt, no further CP , cont same tx

## 2015-07-26 ENCOUNTER — Other Ambulatory Visit: Payer: Self-pay | Admitting: Nurse Practitioner

## 2015-07-26 NOTE — Telephone Encounter (Signed)
Rx(s) sent to pharmacy electronically.  

## 2015-08-05 ENCOUNTER — Other Ambulatory Visit: Payer: Self-pay | Admitting: Internal Medicine

## 2015-08-20 ENCOUNTER — Ambulatory Visit (INDEPENDENT_AMBULATORY_CARE_PROVIDER_SITE_OTHER): Payer: Medicare Other | Admitting: Cardiology

## 2015-08-20 ENCOUNTER — Encounter: Payer: Self-pay | Admitting: Cardiology

## 2015-08-20 VITALS — BP 161/64 | HR 74 | Ht 62.0 in | Wt 190.0 lb

## 2015-08-20 DIAGNOSIS — I10 Essential (primary) hypertension: Secondary | ICD-10-CM

## 2015-08-20 DIAGNOSIS — R0989 Other specified symptoms and signs involving the circulatory and respiratory systems: Secondary | ICD-10-CM | POA: Diagnosis not present

## 2015-08-20 DIAGNOSIS — I25119 Atherosclerotic heart disease of native coronary artery with unspecified angina pectoris: Secondary | ICD-10-CM | POA: Diagnosis not present

## 2015-08-20 DIAGNOSIS — N183 Chronic kidney disease, stage 3 unspecified: Secondary | ICD-10-CM

## 2015-08-20 MED ORDER — METOPROLOL SUCCINATE ER 25 MG PO TB24: 25.0000 mg | ORAL_TABLET | Freq: Every day | ORAL | Status: DC

## 2015-08-20 NOTE — Patient Instructions (Signed)
Medication Instructions:  START Metoprolol XL 25 mg daily  Labwork: NONE  Testing/Procedures: Your physician has requested that you have a carotid duplex. This test is an ultrasound of the carotid arteries in your neck. It looks at blood flow through these arteries that supply the brain with blood. Allow one hour for this exam. There are no restrictions or special instructions.  Follow-Up: Your physician recommends that you schedule a follow-up appointment in: 1 Month with Lurena Joiner or Suanne Marker   Any Other Special Instructions Will Be Listed Below (If Applicable).   If you need a refill on your cardiac medications before your next appointment, please call your pharmacy.

## 2015-08-20 NOTE — Progress Notes (Signed)
Cardiology Office Note   Date:  08/20/2015   ID:  Alexandra Gross, DOB [birth year], MRN [id number]  PCP:  Cathlean Cower, MD  Cardiologist:   Minus Breeding, MD   Chief Complaint  Patient presents with  . Coronary Artery Disease  . Hypertension      History of Present Illness: Alexandra Gross is a 82 y.o. female who presents for  For follow-up of NSTEMI.   The patient does not have single-vessel CAD as described. However, because this was high risk she was managed medically.   At the last visit she was doing well.  She had no med adjustments.  She presents for follow up.  She did have some bleeding in her BMs on Plavx and this was stopped since I last saw her.  She otherwise is doing OK.  She is doing some exercises and has a treadmill.  The patient denies any new symptoms such as chest discomfort, neck or arm discomfort. There has been no new shortness of breath, PND or orthopnea. There have been no reported palpitations, presyncope or syncope.  Of special note she has lost about 30 lbs from her peak weight through diet and exercise!!!  Past Medical History  Diagnosis Date  . Hypertension   . Diabetes mellitus   . Obesity   . Arthritis   . Hyperlipidemia   . B12 deficiency   . CAD (coronary artery disease)     a. 12/2010 Cath: nonobs dzs;  b. 12/2014 NSTEMI/Cath: LM nl, LAD nl, LCX 90p, 41m, OM1 95ost, OM2 small, RCA 50p/d-->PCI of complex LCX/OM lesion considered, felt to be high risk-->Med Rx.  Marland Kitchen History of echocardiogram     a.  Echo 11/16: mild LVH, EF 55%, no RWMA, Gr 1 DD, MAC, mild LAE, normal RVF    Past Surgical History  Procedure Laterality Date  . Appendectomy    . Tumor removed      Abdomen (benign)  . Back surgery    . Abdominal hysterectomy  1972  . Left heart catheterization with coronary angiogram N/A 01/06/2011    Procedure: LEFT HEART CATHETERIZATION WITH CORONARY ANGIOGRAM;  Surgeon: Minus Breeding, MD;  Location: [facility] CATH LAB;  Service: Cardiovascular;   Laterality: N/A;  . Cardiac catheterization N/A 12/31/2014    Procedure: Left Heart Cath and Coronary Angiography;  Surgeon: Peter M Martinique, MD;  Location: [place] CV LAB;  Service: Cardiovascular;  Laterality: N/A;     Current Outpatient Prescriptions  Medication Sig Dispense Refill  . amLODipine (NORVASC) 10 MG tablet Take 10 mg by mouth daily.    Marland Kitchen aspirin 81 MG tablet Take 81 mg by mouth daily.    Marland Kitchen atorvastatin (LIPITOR) 80 MG tablet TAKE 1 TABLET (80 MG TOTAL) BY MOUTH DAILY AT 6 PM. 30 tablet 6  . isosorbide mononitrate (IMDUR) 60 MG 24 hr tablet Take 90 mg by mouth daily. 1.5 TABLETS DAILY    . metFORMIN (GLUCOPHAGE) 500 MG tablet Take 1 tablet (500 mg total) by mouth daily with breakfast. 180 tablet 2  . nitroGLYCERIN (NITROSTAT) 0.4 MG SL tablet PLACE 1 TABLET (0.4 MG TOTAL) UNDER THE TONGUE EVERY 5 (FIVE) MINUTES AS NEEDED FOR CHEST PAIN. 25 tablet 3  . Omega-3 Fatty Acids (FISH OIL) 1000 MG CAPS Take 1 capsule by mouth daily.    . vitamin B-12 (CYANOCOBALAMIN) 500 MCG tablet Take 1 tablet (500 mcg total) by mouth daily. 30 tablet 1  . VITAMIN E PO Take 1 capsule by  mouth daily.      . metoprolol succinate (TOPROL XL) 25 MG 24 hr tablet Take 1 tablet (25 mg total) by mouth daily. 30 tablet 11   No current facility-administered medications for this visit.    Allergies:   Lisinopril and Penicillins    ROS:  Please see the history of present illness.   Otherwise, review of systems are positive for none.   All other systems are reviewed and negative.    PHYSICAL EXAM: VS:  BP 161/64 mmHg  Pulse 74  Ht 5\' 2"  (1.575 m)  Wt 190 lb (86.183 kg)  BMI 34.74 kg/m2  SpO2 97% , BMI Body mass index is 34.74 kg/(m^2). GENERAL:  Well appearing NECK:  No jugular venous distention, waveform within normal limits, carotid upstroke brisk and symmetric, bilateral bruits, no thyromegaly LYMPHATICS:  No cervical, inguinal adenopathy LUNGS:  Clear to auscultation bilaterally BACK:  No CVA  tenderness CHEST:  Unremarkable HEART:  PMI not displaced or sustained,S1 and S2 within normal limits, no S3, no S4, no clicks, no rubs, no murmurs ABD:  Flat, positive bowel sounds normal in frequency in pitch, no bruits, no rebound, no guarding, no midline pulsatile mass, no hepatomegaly, no splenomegaly EXT:  2 plus pulses upper and decreased bilateral DP/PT, no edema, no cyanosis no clubbing    EKG:  EKG is not ordered today.   Recent Labs: 12/30/2014: B Natriuretic Peptide 145.8* 06/19/2015: ALT 19; BUN 31*; Creatinine, Ser 1.77*; Hemoglobin 11.4*; Platelets 264.0; Potassium 4.7; Sodium 139; TSH 1.73    Lipid Panel    Component Value Date/Time   CHOL 119 06/19/2015 1354   TRIG 108.0 06/19/2015 1354   HDL 42.20 06/19/2015 1354   CHOLHDL 3 06/19/2015 1354   VLDL 21.6 06/19/2015 1354   LDLCALC 55 06/19/2015 1354   LDLDIRECT 66.0 12/19/2014 1620      Wt Readings from Last 3 Encounters:  08/20/15 190 lb (86.183 kg)  06/21/15 189 lb (85.73 kg)  02/07/15 192 lb 6.4 oz (87.272 kg)    Lab Results  Component Value Date   CHOL 119 06/19/2015   TRIG 108.0 06/19/2015   HDL 42.20 06/19/2015   LDLCALC 55 06/19/2015   LDLDIRECT 66.0 12/19/2014    Other studies Reviewed: Additional studies/ records that were reviewed today include: Hospital records. Review of the above records demonstrates:  Please see elsewhere in the note.     ASSESSMENT AND PLAN:   CAD:  The patient is doing well not having further symptoms. This point no change in therapy is indicated. She will continue with risk reduction.  HTN:   Her blood pressure is elevated and she does agree to try another medicine. I will add Toprol-XL 25 mg daily. We will titrate this based on future readings.  Hyperlipidemia:  She had a recent excellent lipid as above.  She will remain on the meds as listed.   CKD: Stage 3.  Creat was up slightly in April.  No change in therapy is planned.  BRUIT:  I will check a  carotid Doppler  Diabetes: FU with PCP.  Current medicines are reviewed at length with the patient today.  The patient does not have concerns regarding medicines.  The following changes have been made:  no change  Labs/ tests ordered today include:   Labs Labs as above.     Disposition:   FU with APP in 1 month for BP check.    Signed, Minus Breeding, MD  08/20/2015 5:19 PM  [facility]

## 2015-08-21 DIAGNOSIS — H2513 Age-related nuclear cataract, bilateral: Secondary | ICD-10-CM | POA: Diagnosis not present

## 2015-08-21 DIAGNOSIS — H11139 Conjunctival pigmentations, unspecified eye: Secondary | ICD-10-CM | POA: Diagnosis not present

## 2015-08-21 DIAGNOSIS — H40013 Open angle with borderline findings, low risk, bilateral: Secondary | ICD-10-CM | POA: Diagnosis not present

## 2015-08-21 DIAGNOSIS — E119 Type 2 diabetes mellitus without complications: Secondary | ICD-10-CM | POA: Diagnosis not present

## 2015-08-30 ENCOUNTER — Ambulatory Visit (HOSPITAL_COMMUNITY)
Admission: RE | Admit: 2015-08-30 | Discharge: 2015-08-30 | Disposition: A | Discharge status: Home / Self Care | Payer: Medicare Other | Source: Ambulatory Visit | Attending: Cardiology | Admitting: Cardiology

## 2015-08-30 DIAGNOSIS — E669 Obesity, unspecified: Secondary | ICD-10-CM | POA: Insufficient documentation

## 2015-08-30 DIAGNOSIS — E119 Type 2 diabetes mellitus without complications: Secondary | ICD-10-CM | POA: Insufficient documentation

## 2015-08-30 DIAGNOSIS — I10 Essential (primary) hypertension: Secondary | ICD-10-CM | POA: Diagnosis not present

## 2015-08-30 DIAGNOSIS — Z6834 Body mass index (BMI) 34.0-34.9, adult: Secondary | ICD-10-CM | POA: Diagnosis not present

## 2015-08-30 DIAGNOSIS — I6523 Occlusion and stenosis of bilateral carotid arteries: Secondary | ICD-10-CM | POA: Insufficient documentation

## 2015-08-30 DIAGNOSIS — E785 Hyperlipidemia, unspecified: Secondary | ICD-10-CM | POA: Diagnosis not present

## 2015-08-30 DIAGNOSIS — I2510 Atherosclerotic heart disease of native coronary artery without angina pectoris: Secondary | ICD-10-CM | POA: Insufficient documentation

## 2015-08-30 DIAGNOSIS — R0989 Other specified symptoms and signs involving the circulatory and respiratory systems: Secondary | ICD-10-CM | POA: Diagnosis not present

## 2015-09-03 ENCOUNTER — Telehealth: Payer: Self-pay | Admitting: *Deleted

## 2015-09-03 DIAGNOSIS — R0989 Other specified symptoms and signs involving the circulatory and respiratory systems: Secondary | ICD-10-CM

## 2015-09-03 NOTE — Telephone Encounter (Signed)
Carotid doppler ordered for 1 year 

## 2015-09-03 NOTE — Telephone Encounter (Signed)
-----   Message from Minus Breeding, MD sent at 09/02/2015 10:43 AM EDT ----- Follow up in one year.  60 - 70% right stenosis and less than 50% left.  Call Ms. Georgann Housekeeper with the results and send results to Cathlean Cower, MD

## 2015-09-12 DIAGNOSIS — H2512 Age-related nuclear cataract, left eye: Secondary | ICD-10-CM | POA: Diagnosis not present

## 2015-09-20 ENCOUNTER — Encounter: Payer: Self-pay | Admitting: Cardiology

## 2015-09-20 ENCOUNTER — Ambulatory Visit (INDEPENDENT_AMBULATORY_CARE_PROVIDER_SITE_OTHER): Payer: Medicare Other | Admitting: Cardiology

## 2015-09-20 VITALS — BP 140/60 | HR 55 | Ht 62.0 in | Wt 190.0 lb

## 2015-09-20 DIAGNOSIS — N183 Chronic kidney disease, stage 3 unspecified: Secondary | ICD-10-CM

## 2015-09-20 DIAGNOSIS — R001 Bradycardia, unspecified: Secondary | ICD-10-CM | POA: Insufficient documentation

## 2015-09-20 DIAGNOSIS — I2510 Atherosclerotic heart disease of native coronary artery without angina pectoris: Secondary | ICD-10-CM

## 2015-09-20 DIAGNOSIS — E785 Hyperlipidemia, unspecified: Secondary | ICD-10-CM

## 2015-09-20 DIAGNOSIS — I10 Essential (primary) hypertension: Secondary | ICD-10-CM

## 2015-09-20 DIAGNOSIS — I2583 Coronary atherosclerosis due to lipid rich plaque: Principal | ICD-10-CM

## 2015-09-20 DIAGNOSIS — E1121 Type 2 diabetes mellitus with diabetic nephropathy: Secondary | ICD-10-CM

## 2015-09-20 NOTE — Assessment & Plan Note (Addendum)
HR went from 70 to 55 with the addition of Toprol 25 mg. She is asymptomatic with this.

## 2015-09-20 NOTE — Patient Instructions (Signed)
Kerin Ransom, PA-C wants you to follow-up in: 6 months or sooner if needed with Dr Allena Napoleon will receive a reminder letter in the mail two months in advance. If you don't receive a letter, please call our office to schedule the follow-up appointment.   If you need a refill on your cardiac medications before your next appointment, please call your pharmacy.

## 2015-09-20 NOTE — Assessment & Plan Note (Signed)
B/P under better control with addition of low dose beta blocker

## 2015-09-20 NOTE — Assessment & Plan Note (Signed)
On high dose statin-LDL 55 in April 2017

## 2015-09-20 NOTE — Assessment & Plan Note (Signed)
SCr 1.7 in April 2017

## 2015-09-20 NOTE — Assessment & Plan Note (Signed)
Followed by PCP, on Glucopahge

## 2015-09-20 NOTE — Assessment & Plan Note (Addendum)
Non obstructive by cath per Dr Percival Spanish, Nov 2012, NSTEMI Nov 2016- CFX disease not amenable to PCI- medical Rx. LVF normal by echo

## 2015-09-20 NOTE — Progress Notes (Signed)
09/20/2015 Alexandra Gross   August 21, 1931  [id number]  Primary Physician Alexandra Cower, MD Primary Cardiologist: Dr Alexandra Gross  HPI:  82 y.o. AA female with a hx of HTN, HL, DM2, CKD.  LHC in 2012 had demonstrated non-obstructive CAD.  She was admitted 11/6-11/8/16 with a NSTEMI.  Troponin levels were minimally elevated. LHC demonstrated severe single vessel obstructive CAD with severe stenosis of the proximal LCx involving the takeoff of a large branching OM with an acute angulated bend following the lesion. PCI was felt to be high risk due to morphology, tortuosity and involvement of a major side branch. It was felt that medical therapy should be attempted first. She was not placed on beta blocker secondary to bradycardia. She has done well on medical Rx. Echo in Nov 2016 showed normal LVF with grade 1 DD. She recently saw Dr Alexandra Gross and her B/P was ellvated. Low dose Toprol was added and she is seen back today in f/u. Her HR went from 70 to 55. She is asymptomatic, denies syncope or near syncope or fatigue.    Current Outpatient Prescriptions  Medication Sig Dispense Refill  . amLODipine (NORVASC) 10 MG tablet Take 10 mg by mouth daily.    Marland Kitchen aspirin 81 MG tablet Take 81 mg by mouth daily.    Marland Kitchen atorvastatin (LIPITOR) 80 MG tablet TAKE 1 TABLET (80 MG TOTAL) BY MOUTH DAILY AT 6 PM. 30 tablet 6  . isosorbide mononitrate (IMDUR) 60 MG 24 hr tablet Take 90 mg by mouth daily. 1.5 TABLETS DAILY    . metFORMIN (GLUCOPHAGE) 500 MG tablet Take 1 tablet (500 mg total) by mouth daily with breakfast. 180 tablet 2  . metoprolol succinate (TOPROL XL) 25 MG 24 hr tablet Take 1 tablet (25 mg total) by mouth daily. 30 tablet 11  . nitroGLYCERIN (NITROSTAT) 0.4 MG SL tablet PLACE 1 TABLET (0.4 MG TOTAL) UNDER THE TONGUE EVERY 5 (FIVE) MINUTES AS NEEDED FOR CHEST PAIN. 25 tablet 3  . Omega-3 Fatty Acids (FISH OIL) 1000 MG CAPS Take 1 capsule by mouth daily.    . vitamin B-12 (CYANOCOBALAMIN) 500 MCG tablet Take 1  tablet (500 mcg total) by mouth daily. 30 tablet 1  . VITAMIN E PO Take 1 capsule by mouth daily.      . DUREZOL 0.05 % EMUL Place 1 drop into the left eye daily.  1   No current facility-administered medications for this visit.        Social History   Social History  . Marital status: Widowed    Spouse name: N/A  . Number of children: 1  . Years of education: N/A   Occupational History  . Retired    Social History Main Topics  . Smoking status: Former Smoker    Types: Cigarettes    Quit date: 02/23/1993  . Smokeless tobacco: Never Used  . Alcohol use No  . Drug use: No  . Sexual activity: Not on file   Other Topics Concern  . Not on file   Social History Narrative   Lives alone.  2 grandchildren.  1 great grand.     Review of Systems: General: negative for chills, fever, night sweats or weight changes.  Cardiovascular: negative for chest pain, dyspnea on exertion, edema, orthopnea, palpitations, paroxysmal nocturnal dyspnea or shortness of breath Dermatological: negative for rash Respiratory: negative for cough or wheezing Urologic: negative for hematuria Abdominal: negative for nausea, vomiting, diarrhea, bright red blood per rectum, melena, or hematemesis Neurologic:  negative for visual changes, syncope, or dizziness She had cataract removed OS last week All other systems reviewed and are otherwise negative except as noted above.    Blood pressure 140/60, pulse (!) 55, height 5\' 2"  (1.575 m), weight 190 lb (86.2 kg), SpO2 99 %.  General appearance: alert, cooperative, no distress and mildly obese Neck: no carotid bruit and no JVD Lungs: clear to auscultation bilaterally Heart: regular rate and rhythm Extremities: no edema Neurologic: Grossly normal  EKG NSR, SB-55  ASSESSMENT AND PLAN:   HTN (hypertension) B/P under better control with addition of low dose beta blocker  Bradycardia  HR went from 70 to 55 with the addition of Toprol 25 mg. She is  asymptomatic with this.   CAD (coronary artery disease) Non obstructive by cath per Dr Alexandra Gross, Nov 2012 NSTEMI Nov 2016- CFX disease not amenable to PCI- medical Rx. LVF normal by echo  CKD (chronic kidney disease), stage III SCr 1.7 in April 2017  Type 2 diabetes mellitus, controlled, with renal complications ([place]) Followed by PCP, on Glucopahge  Hyperlipidemia On high dose statin-LDL 55 in April 2017   PLAN  Same Rx. If she developes symptoms of bradycardia I would try 12.5 mg Toprol and add hydralazine 25 mg BID. F/U with Dr Alexandra Gross in Oct, Dr Alexandra Gross 6 months.   Alexandra Gross 09/20/2015 3:49 PM

## 2015-10-16 DIAGNOSIS — H2511 Age-related nuclear cataract, right eye: Secondary | ICD-10-CM | POA: Diagnosis not present

## 2015-10-18 DIAGNOSIS — E119 Type 2 diabetes mellitus without complications: Secondary | ICD-10-CM | POA: Diagnosis not present

## 2015-10-24 DIAGNOSIS — H2511 Age-related nuclear cataract, right eye: Secondary | ICD-10-CM | POA: Diagnosis not present

## 2015-12-12 ENCOUNTER — Other Ambulatory Visit: Payer: Self-pay | Admitting: *Deleted

## 2015-12-12 MED ORDER — METFORMIN HCL 500 MG PO TABS: 500.0000 mg | ORAL_TABLET | Freq: Every day | ORAL | 1 refills | Status: DC

## 2015-12-23 ENCOUNTER — Other Ambulatory Visit (INDEPENDENT_AMBULATORY_CARE_PROVIDER_SITE_OTHER): Payer: Medicare Other

## 2015-12-23 DIAGNOSIS — E119 Type 2 diabetes mellitus without complications: Secondary | ICD-10-CM

## 2015-12-23 LAB — BASIC METABOLIC PANEL
BUN: 27 mg/dL — ABNORMAL HIGH (ref 6–23)
CHLORIDE: 104 meq/L (ref 96–112)
CO2: 25 mEq/L (ref 19–32)
Calcium: 9.6 mg/dL (ref 8.4–10.5)
Creatinine, Ser: 1.57 mg/dL — ABNORMAL HIGH (ref 0.40–1.20)
GFR: 40.52 mL/min — ABNORMAL LOW (ref >60.00)
Glucose, Bld: 132 mg/dL — ABNORMAL HIGH (ref 70–99)
POTASSIUM: 4.2 meq/L (ref 3.5–5.1)
SODIUM: 141 meq/L (ref 135–145)

## 2015-12-23 LAB — LIPID PANEL
CHOL/HDL RATIO: 3
Cholesterol: 141 mg/dL (ref 0–200)
HDL: 46.6 mg/dL (ref >39.00)
LDL Cholesterol: 61 mg/dL (ref 0–99)
NONHDL: 94.74
Triglycerides: 167 mg/dL — ABNORMAL HIGH (ref 0.0–149.0)
VLDL: 33.4 mg/dL (ref 0.0–40.0)

## 2015-12-23 LAB — HEPATIC FUNCTION PANEL
ALT: 20 U/L (ref 0–35)
AST: 20 U/L (ref 0–37)
Albumin: 4.1 g/dL (ref 3.5–5.2)
Alkaline Phosphatase: 64 U/L (ref 39–117)
BILIRUBIN DIRECT: 0.2 mg/dL (ref 0.0–0.3)
BILIRUBIN TOTAL: 0.7 mg/dL (ref 0.2–1.2)
Total Protein: 7.9 g/dL (ref 6.0–8.3)

## 2015-12-23 LAB — HEMOGLOBIN A1C: Hgb A1c MFr Bld: 6.9 % — ABNORMAL HIGH (ref 4.6–6.5)

## 2015-12-24 ENCOUNTER — Ambulatory Visit (INDEPENDENT_AMBULATORY_CARE_PROVIDER_SITE_OTHER): Payer: Medicare Other | Admitting: Internal Medicine

## 2015-12-24 ENCOUNTER — Encounter: Payer: Self-pay | Admitting: Internal Medicine

## 2015-12-24 VITALS — BP 138/80 | HR 80 | Temp 98.0°F | Resp 20 | Wt 193.0 lb

## 2015-12-24 DIAGNOSIS — Z0001 Encounter for general adult medical examination with abnormal findings: Secondary | ICD-10-CM

## 2015-12-24 DIAGNOSIS — I10 Essential (primary) hypertension: Secondary | ICD-10-CM | POA: Diagnosis not present

## 2015-12-24 DIAGNOSIS — N183 Chronic kidney disease, stage 3 unspecified: Secondary | ICD-10-CM

## 2015-12-24 DIAGNOSIS — E1121 Type 2 diabetes mellitus with diabetic nephropathy: Secondary | ICD-10-CM

## 2015-12-24 DIAGNOSIS — E785 Hyperlipidemia, unspecified: Secondary | ICD-10-CM

## 2015-12-24 NOTE — Progress Notes (Signed)
Subjective:    Patient ID: Alexandra Gross, female    DOB: 10-Apr-1931, 82 y.o.   MRN: [id number]  HPI  Here to f/u; overall doing ok,  Pt denies chest pain, increasing sob or doe, wheezing, orthopnea, PND, increased LE swelling, palpitations, dizziness or syncope.  Pt denies new neurological symptoms such as new headache, or facial or extremity weakness or numbness.  Pt denies polydipsia, polyuria, or low sugar episode.   Pt denies new neurological symptoms such as new headache, or facial or extremity weakness or numbness.   Pt states overall good compliance with meds  S/p bilat cataracts removed, now on eye drop for dry eyes. Declines flu shot.  Past Medical History:  Diagnosis Date  . Arthritis   . B12 deficiency   . CAD (coronary artery disease)    a. 12/2010 Cath: nonobs dzs;  b. 12/2014 NSTEMI/Cath: LM nl, LAD nl, LCX 90p, 2m, OM1 95ost, OM2 small, RCA 50p/d-->PCI of complex LCX/OM lesion considered, felt to be high risk-->Med Rx.  . Diabetes mellitus   . History of echocardiogram    a.  Echo 11/16: mild LVH, EF 55%, no RWMA, Gr 1 DD, MAC, mild LAE, normal RVF  . Hyperlipidemia   . Hypertension   . Obesity    Past Surgical History:  Procedure Laterality Date  . ABDOMINAL HYSTERECTOMY  1972  . APPENDECTOMY    . BACK SURGERY    . CARDIAC CATHETERIZATION N/A 12/31/2014   Procedure: Left Heart Cath and Coronary Angiography;  Surgeon: Peter M Martinique, MD;  Location: [facility];  Service: Cardiovascular;  Laterality: N/A;  . LEFT HEART CATHETERIZATION WITH CORONARY ANGIOGRAM N/A 01/06/2011   Procedure: LEFT HEART CATHETERIZATION WITH CORONARY ANGIOGRAM;  Surgeon: Minus Breeding, MD;  Location: [facility] CATH LAB;  Service: Cardiovascular;  Laterality: N/A;  . tumor removed     Abdomen (benign)    reports that she quit smoking about 22 years ago. Her smoking use included Cigarettes. She has never used smokeless tobacco. She reports that she does not drink alcohol or use drugs. family  history includes Arthritis in her mother; Diabetes in her mother; Hypertension in her mother.  Current Outpatient Prescriptions on File Prior to Visit  Medication Sig Dispense Refill  . amLODipine (NORVASC) 10 MG tablet Take 10 mg by mouth daily.    Marland Kitchen aspirin 81 MG tablet Take 81 mg by mouth daily.    . DUREZOL 0.05 % EMUL Place 1 drop into the left eye daily.  1  . isosorbide mononitrate (IMDUR) 60 MG 24 hr tablet Take 90 mg by mouth daily. 1.5 TABLETS DAILY    . metFORMIN (GLUCOPHAGE) 500 MG tablet Take 1 tablet (500 mg total) by mouth daily with breakfast. 90 tablet 1  . metoprolol succinate (TOPROL XL) 25 MG 24 hr tablet Take 1 tablet (25 mg total) by mouth daily. 30 tablet 11  . nitroGLYCERIN (NITROSTAT) 0.4 MG SL tablet PLACE 1 TABLET (0.4 MG TOTAL) UNDER THE TONGUE EVERY 5 (FIVE) MINUTES AS NEEDED FOR CHEST PAIN. 25 tablet 3  . Omega-3 Fatty Acids (FISH OIL) 1000 MG CAPS Take 1 capsule by mouth daily.    . vitamin B-12 (CYANOCOBALAMIN) 500 MCG tablet Take 1 tablet (500 mcg total) by mouth daily. 30 tablet 1  . VITAMIN E PO Take 1 capsule by mouth daily.       No current facility-administered medications on file prior to visit.     Review of Systems  Constitutional: Negative  for unusual diaphoresis or night sweats HENT: Negative for ear swelling or discharge Eyes: Negative for worsening visual haziness  Respiratory: Negative for choking and stridor.   Gastrointestinal: Negative for distension or worsening eructation Genitourinary: Negative for retention or change in urine volume.  Musculoskeletal: Negative for other MSK pain or swelling Skin: Negative for color change and worsening wound Neurological: Negative for tremors and numbness other than noted  Psychiatric/Behavioral: Negative for decreased concentration or agitation other than above   All other system neg per pt    Objective:   Physical Exam BP 138/80   Pulse 80   Temp 98 F (36.7 C) (Oral)   Resp 20   Wt 193 lb  (87.5 kg)   SpO2 98%   BMI 35.30 kg/m   VS noted, obese Constitutional: Pt appears in no apparent distress HENT: Head: NCAT.  Right Ear: External ear normal.  Left Ear: External ear normal.  Eyes: . Pupils are equal, round, and reactive to light. Conjunctivae and EOM are normal Neck: Normal range of motion. Neck supple.  Cardiovascular: Normal rate and regular rhythm.   Pulmonary/Chest: Effort normal and breath sounds without rales or wheezing.  Abd:  Soft, NT, ND, + BS Neurological: Pt is alert. Not confused , motor grossly intact Skin: Skin is warm. No rash, trace left > right ankle edema Psychiatric: Pt behavior is normal. No agitation.     Assessment & Plan:

## 2015-12-24 NOTE — Patient Instructions (Addendum)
Please continue all other medications as before, and refills have been done if requested.  Please have the pharmacy call with any other refills you may need.  Please continue your efforts at being more active, low cholesterol diet, and weight control..  Please keep your appointments with your specialists as you may have planned  Please return in 6 months, or sooner if needed, with Lab testing done 3-5 days before  

## 2015-12-24 NOTE — Progress Notes (Signed)
Pre visit review using our clinic review tool, if applicable. No additional management support is needed unless otherwise documented below in the visit note. 

## 2015-12-25 DIAGNOSIS — I219 Acute myocardial infarction, unspecified: Secondary | ICD-10-CM

## 2015-12-25 HISTORY — DX: Acute myocardial infarction, unspecified: I21.9

## 2015-12-26 ENCOUNTER — Other Ambulatory Visit: Payer: Self-pay

## 2015-12-26 MED ORDER — ATORVASTATIN CALCIUM 80 MG PO TABS: 80.0000 mg | ORAL_TABLET | Freq: Every day | ORAL | 6 refills | Status: DC

## 2015-12-30 NOTE — Assessment & Plan Note (Signed)
.  stable overall by history and exam, recent data reviewed with pt, and pt to continue medical treatment as before,  to f/u any worsening symptoms or concerns Lab Results  Component Value Date   CREATININE 1.57 (H) 12/23/2015

## 2015-12-30 NOTE — Assessment & Plan Note (Signed)
stable overall by history and exam, recent data reviewed with pt, and pt to continue medical treatment as before,  to f/u any worsening symptoms or concerns BP Readings from Last 3 Encounters:  12/24/15 138/80  09/20/15 140/60  08/20/15 (!) 161/64

## 2015-12-30 NOTE — Assessment & Plan Note (Signed)
stable overall by history and exam, recent data reviewed with pt, and pt to continue medical treatment as before,  to f/u any worsening symptoms or concerns Lab Results  Component Value Date   HGBA1C 6.9 (H) 12/23/2015

## 2015-12-30 NOTE — Assessment & Plan Note (Signed)
stable overall by history and exam, recent data reviewed with pt, and pt to continue medical treatment as before,  to f/u any worsening symptoms or concerns Lab Results  Component Value Date   LDLCALC 61 12/23/2015

## 2016-01-20 DIAGNOSIS — E119 Type 2 diabetes mellitus without complications: Secondary | ICD-10-CM | POA: Diagnosis not present

## 2016-01-27 ENCOUNTER — Other Ambulatory Visit: Payer: Self-pay | Admitting: Internal Medicine

## 2016-01-27 ENCOUNTER — Other Ambulatory Visit: Payer: Self-pay | Admitting: Cardiovascular Disease

## 2016-04-23 DIAGNOSIS — E119 Type 2 diabetes mellitus without complications: Secondary | ICD-10-CM | POA: Diagnosis not present

## 2016-05-27 ENCOUNTER — Other Ambulatory Visit: Payer: Self-pay | Admitting: Internal Medicine

## 2016-06-23 ENCOUNTER — Ambulatory Visit (INDEPENDENT_AMBULATORY_CARE_PROVIDER_SITE_OTHER): Payer: Medicare Other | Admitting: Internal Medicine

## 2016-06-23 ENCOUNTER — Encounter: Payer: Self-pay | Admitting: Internal Medicine

## 2016-06-23 ENCOUNTER — Other Ambulatory Visit (INDEPENDENT_AMBULATORY_CARE_PROVIDER_SITE_OTHER): Payer: Medicare Other

## 2016-06-23 VITALS — BP 162/52 | HR 64 | Ht 62.0 in | Wt 199.0 lb

## 2016-06-23 DIAGNOSIS — Z0000 Encounter for general adult medical examination without abnormal findings: Secondary | ICD-10-CM | POA: Diagnosis not present

## 2016-06-23 DIAGNOSIS — E1121 Type 2 diabetes mellitus with diabetic nephropathy: Secondary | ICD-10-CM

## 2016-06-23 LAB — URINALYSIS, ROUTINE W REFLEX MICROSCOPIC
BILIRUBIN URINE: NEGATIVE
LEUKOCYTES UA: NEGATIVE
Nitrite: NEGATIVE
PH: 5.5 (ref 5.0–8.0)
RBC / HPF: NONE SEEN (ref 0–?)
SPECIFIC GRAVITY, URINE: 1.02 (ref 1.000–1.030)
TOTAL PROTEIN, URINE-UPE24: NEGATIVE
UROBILINOGEN UA: 0.2 (ref 0.0–1.0)
Urine Glucose: NEGATIVE

## 2016-06-23 LAB — BASIC METABOLIC PANEL
BUN: 39 mg/dL — ABNORMAL HIGH (ref 6–23)
CALCIUM: 9.5 mg/dL (ref 8.4–10.5)
CHLORIDE: 108 meq/L (ref 96–112)
CO2: 25 meq/L (ref 19–32)
Creatinine, Ser: 1.61 mg/dL — ABNORMAL HIGH (ref 0.40–1.20)
GFR: 39.31 mL/min — ABNORMAL LOW (ref >60.00)
GLUCOSE: 158 mg/dL — AB (ref 70–99)
Potassium: 4.6 mEq/L (ref 3.5–5.1)
SODIUM: 138 meq/L (ref 135–145)

## 2016-06-23 LAB — HEPATIC FUNCTION PANEL
ALBUMIN: 3.7 g/dL (ref 3.5–5.2)
ALK PHOS: 60 U/L (ref 39–117)
ALT: 15 U/L (ref 0–35)
AST: 14 U/L (ref 0–37)
BILIRUBIN DIRECT: 0.1 mg/dL (ref 0.0–0.3)
TOTAL PROTEIN: 7.5 g/dL (ref 6.0–8.3)
Total Bilirubin: 0.4 mg/dL (ref 0.2–1.2)

## 2016-06-23 LAB — LIPID PANEL
Cholesterol: 218 mg/dL — ABNORMAL HIGH (ref 0–200)
HDL: 50.8 mg/dL (ref >39.00)
LDL Cholesterol: 137 mg/dL — ABNORMAL HIGH (ref 0–99)
NONHDL: 166.98
Total CHOL/HDL Ratio: 4
Triglycerides: 152 mg/dL — ABNORMAL HIGH (ref 0.0–149.0)
VLDL: 30.4 mg/dL (ref 0.0–40.0)

## 2016-06-23 LAB — CBC WITH DIFFERENTIAL/PLATELET
BASOS ABS: 0.1 10*3/uL (ref 0.0–0.1)
Basophils Relative: 1 % (ref 0.0–3.0)
EOS ABS: 0.1 10*3/uL (ref 0.0–0.7)
Eosinophils Relative: 1.3 % (ref 0.0–5.0)
HCT: 34.9 % — ABNORMAL LOW (ref 36.0–46.0)
Hemoglobin: 11.3 g/dL — ABNORMAL LOW (ref 12.0–15.0)
LYMPHS ABS: 4.1 10*3/uL — AB (ref 0.7–4.0)
Lymphocytes Relative: 35.9 % (ref 12.0–46.0)
MCHC: 32.5 g/dL (ref 30.0–36.0)
MCV: 87.7 fl (ref 78.0–100.0)
MONO ABS: 1.2 10*3/uL — AB (ref 0.1–1.0)
Monocytes Relative: 10.6 % (ref 3.0–12.0)
NEUTROS ABS: 5.8 10*3/uL (ref 1.4–7.7)
NEUTROS PCT: 51.2 % (ref 43.0–77.0)
PLATELETS: 260 10*3/uL (ref 150.0–400.0)
RBC: 3.98 Mil/uL (ref 3.87–5.11)
RDW: 14.1 % (ref 11.5–15.5)
WBC: 11.3 10*3/uL — ABNORMAL HIGH (ref 4.0–10.5)

## 2016-06-23 LAB — TSH: TSH: 2.53 u[IU]/mL (ref 0.35–4.50)

## 2016-06-23 LAB — MICROALBUMIN / CREATININE URINE RATIO
CREATININE, U: 188.3 mg/dL
MICROALB/CREAT RATIO: 3.3 mg/g (ref 0.0–30.0)
Microalb, Ur: 6.3 mg/dL — ABNORMAL HIGH (ref 0.0–1.9)

## 2016-06-23 LAB — HEMOGLOBIN A1C: HEMOGLOBIN A1C: 7.4 % — AB (ref 4.6–6.5)

## 2016-06-23 MED ORDER — AMOXICILLIN 500 MG PO CAPS: ORAL_CAPSULE | ORAL | 1 refills | Status: DC

## 2016-06-23 NOTE — Patient Instructions (Signed)
Please continue all other medications as before, and refills have been done if requested.  Please have the pharmacy call with any other refills you may need.  Please continue your efforts at being more active, low cholesterol diet, and weight control.  You are otherwise up to date with prevention measures today.  Please keep your appointments with your specialists as you may have planned  Please go to the LAB in the Basement (turn left off the elevator) for the tests to be done today  You will be contacted by phone if any changes need to be made immediately.  Otherwise, you will receive a letter about your results with an explanation, but please check with MyChart first.  Please remember to sign up for MyChart if you have not done so, as this will be important to you in the future with finding out test results, communicating by private email, and scheduling acute appointments online when needed.  If you have Medicare related insurance (such as traditional Medicare, [employer] or [employer], or similar), Please make an appointment at the [facility] with Sharee Pimple, the [employer], for your Wellness Visit in this office, which is a benefit with your insurance.  Please return in 6 months, or sooner if needed, with Lab testing done 3-5 days before

## 2016-06-23 NOTE — Progress Notes (Signed)
Subjective:    Patient ID: Alexandra Gross, female    DOB: Nov 17, 1931, 83 y.o.   MRN: [id number]  HPI  Here for wellness and f/u;  Overall doing ok;  Pt denies Chest pain, worsening SOB, DOE, wheezing, orthopnea, PND, worsening LE edema, palpitations, dizziness or syncope.  Pt denies neurological change such as new headache, facial or extremity weakness.  Pt denies polydipsia, polyuria, or low sugar symptoms. Pt states overall good compliance with treatment and medications, good tolerability, and has been trying to follow appropriate diet.  Pt denies worsening depressive symptoms, suicidal ideation or panic. No fever, night sweats, wt loss, loss of appetite, or other constitutional symptoms.  Pt states good ability with ADL's, has low fall risk, home safety reviewed and adequate, no other significant changes in hearing or vision, and only occasionally active with exercise.  Declines dx and Tdap today.  Has gained several lbs due to snacking, trying to do better now.  BP at home haws been < 140/90, declines med change. Some sad today as lifelong friend passed away last wk. Wt Readings from Last 3 Encounters:  06/23/16 199 lb (90.3 kg)  12/24/15 193 lb (87.5 kg)  09/20/15 190 lb (86.2 kg)   BP Readings from Last 3 Encounters:  06/23/16 (!) 162/52  12/24/15 138/80  09/20/15 140/60   Past Medical History:  Diagnosis Date  . Arthritis   . B12 deficiency   . CAD (coronary artery disease)    a. 12/2010 Cath: nonobs dzs;  b. 12/2014 NSTEMI/Cath: LM nl, LAD nl, LCX 90p, 38m OM1 95ost, OM2 small, RCA 50p/d-->PCI of complex LCX/OM lesion considered, felt to be high risk-->Med Rx.  . Diabetes mellitus   . History of echocardiogram    a.  Echo 11/16: mild LVH, EF 55%, no RWMA, Gr 1 DD, MAC, mild LAE, normal RVF  . Hyperlipidemia   . Hypertension   . Obesity    Past Surgical History:  Procedure Laterality Date  . ABDOMINAL HYSTERECTOMY  1972  . APPENDECTOMY    . BACK SURGERY    . CARDIAC  CATHETERIZATION N/A 12/31/2014   Procedure: Left Heart Cath and Coronary Angiography;  Surgeon: Peter M JMartinique MD;  [facility];  Service: Cardiovascular;  Laterality: N/A;  . LEFT HEART CATHETERIZATION WITH CORONARY ANGIOGRAM N/A 01/06/2011   Procedure: LEFT HEART CATHETERIZATION WITH CORONARY ANGIOGRAM;  Surgeon: JMinus Breeding MD;  [facility];  Service: Cardiovascular;  Laterality: N/A;  . tumor removed     Abdomen (benign)    reports that she quit smoking about 23 years ago. Her smoking use included Cigarettes. She has never used smokeless tobacco. She reports that she does not drink alcohol or use drugs. family history includes Arthritis in her mother; Diabetes in her mother; Hypertension in her mother. Allergies  Allergen Reactions  . Lisinopril Other (See Comments)    unknown  . Penicillins Other (See Comments)   Current Outpatient Prescriptions on File Prior to Visit  Medication Sig Dispense Refill  . amLODipine (NORVASC) 10 MG tablet TAKE 1 TABLET BY MOUTH EVERY DAY 90 tablet 1  . aspirin 81 MG tablet Take 81 mg by mouth daily.    .Marland Kitchenatorvastatin (LIPITOR) 80 MG tablet Take 1 tablet (80 mg total) by mouth daily at 6 PM. 30 tablet 6  . clopidogrel (PLAVIX) 75 MG tablet TAKE 1 TABLET (75 MG TOTAL) BY MOUTH DAILY. 30 tablet 4  . DUREZOL 0.05 % EMUL Place 1 drop  into the left eye daily.  1  . isosorbide mononitrate (IMDUR) 60 MG 24 hr tablet Take 90 mg by mouth daily. 1.5 TABLETS DAILY    . metFORMIN (GLUCOPHAGE) 500 MG tablet TAKE 1 TABLET (500 MG TOTAL) BY MOUTH DAILY WITH BREAKFAST. 90 tablet 2  . metoprolol succinate (TOPROL XL) 25 MG 24 hr tablet Take 1 tablet (25 mg total) by mouth daily. 30 tablet 11  . nitroGLYCERIN (NITROSTAT) 0.4 MG SL tablet PLACE 1 TABLET (0.4 MG TOTAL) UNDER THE TONGUE EVERY 5 (FIVE) MINUTES AS NEEDED FOR CHEST PAIN. 25 tablet 3  . Omega-3 Fatty Acids (FISH OIL) 1000 MG CAPS Take 1 capsule by mouth daily.    . vitamin B-12  (CYANOCOBALAMIN) 500 MCG tablet Take 1 tablet (500 mcg total) by mouth daily. 30 tablet 1  . VITAMIN E PO Take 1 capsule by mouth daily.       No current facility-administered medications on file prior to visit.    Review of Systems Constitutional: Negative for other unusual diaphoresis, sweats, appetite or weight changes HENT: Negative for other worsening hearing loss, ear pain, facial swelling, mouth sores or neck stiffness.   Eyes: Negative for other worsening pain, redness or other visual disturbance.  Respiratory: Negative for other stridor or swelling Cardiovascular: Negative for other palpitations or other chest pain  Gastrointestinal: Negative for worsening diarrhea or loose stools, blood in stool, distention or other pain Genitourinary: Negative for hematuria, flank pain or other change in urine volume.  Musculoskeletal: Negative for myalgias or other joint swelling.  Skin: Negative for other color change, or other wound or worsening drainage.  Neurological: Negative for other syncope or numbness. Hematological: Negative for other adenopathy or swelling Psychiatric/Behavioral: Negative for hallucinations, other worsening agitation, SI, self-injury, or new decreased concentration All other system neg per pt    Objective:   Physical Exam BP (!) 162/52   Pulse 64   Ht 5' 2"  (1.575 m)   Wt 199 lb (90.3 kg)   SpO2 99%   BMI 36.40 kg/m  VS noted,  Constitutional: Pt is oriented to person, place, and time. Appears well-developed and well-nourished, in no significant distress and comfortable Head: Normocephalic and atraumatic  Eyes: Conjunctivae and EOM are normal. Pupils are equal, round, and reactive to light Right Ear: External ear normal without discharge Left Ear: External ear normal without discharge Nose: Nose without discharge or deformity Mouth/Throat: Oropharynx is without other ulcerations and moist  Neck: Normal range of motion. Neck supple. No JVD present. No  tracheal deviation present or significant neck LA or mass Cardiovascular: Normal rate, regular rhythm, normal heart sounds and intact distal pulses.   Pulmonary/Chest: WOB normal and breath sounds without rales or wheezing  Abdominal: Soft. Bowel sounds are normal. NT. No HSM  Musculoskeletal: Normal range of motion. Exhibits no edema Lymphadenopathy: Has no other cervical adenopathy.  Neurological: Pt is alert and oriented to person, place, and time. Pt has normal reflexes. No cranial nerve deficit. Motor grossly intact, Gait intact Skin: Skin is warm and dry. No rash noted or new ulcerations Psychiatric:  Has mild sad mood and affect. Behavior is normal without agitation No other exam findgins  Assessment & Plan:

## 2016-06-23 NOTE — Progress Notes (Signed)
Pre visit review using our clinic review tool, if applicable. No additional management support is needed unless otherwise documented below in the visit note. 

## 2016-06-24 ENCOUNTER — Encounter: Payer: Self-pay | Admitting: Internal Medicine

## 2016-06-25 NOTE — Assessment & Plan Note (Signed)
stable overall by history and exam, recent data reviewed with pt, and pt to continue medical treatment as before,  to f/u any worsening symptoms or concerns Lab Results  Component Value Date   HGBA1C 7.4 (H) 06/23/2016

## 2016-06-25 NOTE — Assessment & Plan Note (Signed)

## 2016-07-14 ENCOUNTER — Other Ambulatory Visit: Payer: Self-pay | Admitting: Physician Assistant

## 2016-07-14 NOTE — Telephone Encounter (Signed)
Rx request sent to pharmacy.  

## 2016-07-29 ENCOUNTER — Other Ambulatory Visit: Payer: Self-pay | Admitting: *Deleted

## 2016-07-29 MED ORDER — METOPROLOL SUCCINATE ER 25 MG PO TB24: 25.0000 mg | ORAL_TABLET | Freq: Every day | ORAL | 0 refills | Status: DC

## 2016-08-17 ENCOUNTER — Other Ambulatory Visit: Payer: Self-pay | Admitting: Internal Medicine

## 2016-09-22 ENCOUNTER — Ambulatory Visit (HOSPITAL_COMMUNITY)
Admission: RE | Admit: 2016-09-22 | Discharge: 2016-09-22 | Disposition: A | Discharge status: Home / Self Care | Payer: Medicare Other | Source: Ambulatory Visit | Attending: Cardiovascular Disease | Admitting: Cardiovascular Disease

## 2016-09-22 DIAGNOSIS — R0989 Other specified symptoms and signs involving the circulatory and respiratory systems: Secondary | ICD-10-CM | POA: Diagnosis not present

## 2016-09-22 DIAGNOSIS — I6523 Occlusion and stenosis of bilateral carotid arteries: Secondary | ICD-10-CM | POA: Diagnosis not present

## 2016-09-25 ENCOUNTER — Telehealth: Payer: Self-pay | Admitting: *Deleted

## 2016-09-25 ENCOUNTER — Telehealth: Payer: Self-pay | Admitting: Cardiology

## 2016-09-25 DIAGNOSIS — I6523 Occlusion and stenosis of bilateral carotid arteries: Secondary | ICD-10-CM

## 2016-09-25 NOTE — Telephone Encounter (Signed)
-----   Message from Skeet Latch, MD sent at 09/25/2016 10:16 AM EDT ----- Moderate R carotid stenosis and mild L carotid stenosis.  Continue aspirin, plavix, and statin.  Repeat in 1 year.

## 2016-09-25 NOTE — Telephone Encounter (Signed)
Advised patient

## 2016-09-25 NOTE — Telephone Encounter (Signed)
Returned call to patient, stated she has been having swelling in lower legs and feet, sob for the past 2 to 3 months.She does not weigh her self.Appointment already scheduled with Rosaria Ferries PA 10/22/16 at 10:30 am.Advised no other appointments available.Advised I will have you put on our cancellation list.Advised to go to ED if needed.

## 2016-09-25 NOTE — Telephone Encounter (Signed)
New Message  Pt c/o swelling: STAT is pt has developed SOB within 24 hours  1. How long have you been experiencing swelling? Two or Three months  2. Where is the swelling located? Feet and legs. Pt voiced also has bruising on her legs and her feet.  3.  Are you currently taking a "fluid pill"? No, pt voiced she has ran out  4.  Are you currently SOB? Yes  5.  Have you traveled recently? No

## 2016-10-06 ENCOUNTER — Other Ambulatory Visit: Payer: Self-pay

## 2016-10-06 MED ORDER — METOPROLOL SUCCINATE ER 25 MG PO TB24: 25.0000 mg | ORAL_TABLET | Freq: Every day | ORAL | 0 refills | Status: DC

## 2016-10-22 ENCOUNTER — Encounter: Payer: Self-pay | Admitting: Physician Assistant

## 2016-10-22 ENCOUNTER — Ambulatory Visit (INDEPENDENT_AMBULATORY_CARE_PROVIDER_SITE_OTHER): Payer: Medicare Other | Admitting: Physician Assistant

## 2016-10-22 VITALS — BP 144/68 | HR 72 | Ht 62.0 in | Wt 195.8 lb

## 2016-10-22 DIAGNOSIS — K921 Melena: Secondary | ICD-10-CM

## 2016-10-22 DIAGNOSIS — R0609 Other forms of dyspnea: Secondary | ICD-10-CM | POA: Diagnosis not present

## 2016-10-22 DIAGNOSIS — Z79899 Other long term (current) drug therapy: Secondary | ICD-10-CM

## 2016-10-22 DIAGNOSIS — I2510 Atherosclerotic heart disease of native coronary artery without angina pectoris: Secondary | ICD-10-CM | POA: Diagnosis not present

## 2016-10-22 MED ORDER — FUROSEMIDE 20 MG PO TABS: 20.0000 mg | ORAL_TABLET | ORAL | 3 refills | Status: DC

## 2016-10-22 MED ORDER — NITROGLYCERIN 0.4 MG SL SUBL: SUBLINGUAL_TABLET | SUBLINGUAL | 3 refills | Status: AC

## 2016-10-22 MED ORDER — ATORVASTATIN CALCIUM 40 MG PO TABS: 40.0000 mg | ORAL_TABLET | Freq: Every day | ORAL | 2 refills | Status: DC

## 2016-10-22 MED ORDER — ISOSORBIDE MONONITRATE ER 60 MG PO TB24
ORAL_TABLET | ORAL | 3 refills | Status: AC
Start: 2016-10-22 — End: ?

## 2016-10-22 MED ORDER — METOPROLOL SUCCINATE ER 25 MG PO TB24: 25.0000 mg | ORAL_TABLET | Freq: Every day | ORAL | 2 refills | Status: DC

## 2016-10-22 NOTE — Patient Instructions (Signed)
Medication Instructions:  START LASIX 20MG  TWICE WEEKLY ON MONDAYS AND THURSDAYS-OK TO TAKE EXTRA FOR INCREASED SWELLING  RE-START TOPROL 25MG  DAILY  RESTART LIPITOR 20MG  DAILY If you need a refill on your cardiac medications before your next appointment, please call your pharmacy.  Labwork: BMET TODAY HERE IN OUR OFFICE AT [facility]  Testing/Procedures: Your physician has requested that you have an echocardiogram. Echocardiography is a painless test that uses sound waves to create images of your heart. It provides your doctor with information about the size and shape of your heart and how well your heart's chambers and valves are working. This procedure takes approximately one hour. There are no restrictions for this procedure.  Follow-Up: Your physician wants you to follow-up in: 1-2 MONTHS WITH DR [facility] ECHO.   Special Instructions: REFERRAL TO GI FOR BLOODY STOOLS  Thank you for choosing [facility] at [facility]!!

## 2016-10-22 NOTE — Progress Notes (Signed)
Cardiology Office Note   Date:  10/22/2016   ID:  Alexandra Gross, DOB Feb 13, 1932, MRN [id number]  PCP:  Alexandra Borg, Gross  Cardiologist:  Alexandra Gross 08/20/2015 Alexandra Ferries, PA-C   Chief Complaint  Patient presents with  . Leg Swelling  . Shortness of Breath    History of Present Illness: Alexandra Gross is a 83 y.o. female with a history of NSTEMI 2016 w/ med rx for CFX 90%, OM 95% (PCI would be high risk), HTN, HLD, B12 def, obesity, OA.  09/25/2016 phone notes about LE edema, appt made 09/22/2016 Carotid duplex w/ mod R ICA and mild L ICA dz.  Alexandra Gross presents for cardiology follow up.  Her grandson has moved in with her to help.She admits that her memory is poor and she can't remember things like she used to.  She keeps her daily meds in a weekly box.  She is out of Plavix, not sure how long.   She has had bloody bowel movements at times, the last time was within the last 2 weeks. She describes bleeding after d/c 2016 from her MI. She thought it was a medication and stopped taking it. She does not remember which medication. She has never been on blood thinners, just ASA and Plavix. H&H was 10.8/33.7 in 2016, 11.3/34.9 06/2016.  She gets SOB doing housework. She gets SOB walking to the mailbox. She denies chest pain. She has never taken nitroglycerin. She admits that her activity level is poor.  She walks with a cane at times, that helps her balance. She has knee pain, better with some OTC braces on.  She has discoloration on the skin of her L leg, that is why she made the appt. She has had mild lower extremity edema, she does not wake with it. She does not know if it got worse after she started wearing the knee braces she is wearing today. She does not use salt on her food, but does not know how much salt to some food that she eats. She no she is a diabetic but isn't quite sure what is involved in a diabetic diet except that she should not eat sugary foods.   Past  Medical History:  Diagnosis Date  . Arthritis   . B12 deficiency   . CAD (coronary artery disease)    a. 12/2010 Cath: nonobs dzs;  b. 12/2014 NSTEMI/Cath: LM nl, LAD nl, LCX 90p, 89m OM1 95ost, OM2 small, RCA 50p/d-->PCI of complex LCX/OM lesion considered, felt to be high risk-->Med Rx.  . Diabetes mellitus   . History of echocardiogram    a.  Echo 11/16: mild LVH, EF 55%, no RWMA, Gr 1 DD, MAC, mild LAE, normal RVF  . Hyperlipidemia   . Hypertension   . Obesity     Past Surgical History:  Procedure Laterality Date  . ABDOMINAL HYSTERECTOMY  1972  . APPENDECTOMY    . BACK SURGERY    . CARDIAC CATHETERIZATION N/A 12/31/2014   Procedure: Left Heart Cath and Coronary Angiography;  Surgeon: Alexandra Gross;  [facility];  Service: Cardiovascular;  Laterality: N/A;  . LEFT HEART CATHETERIZATION WITH CORONARY ANGIOGRAM N/A 01/06/2011   Procedure: LEFT HEART CATHETERIZATION WITH CORONARY ANGIOGRAM;  Surgeon: Alexandra Gross;  [facility];  Service: Cardiovascular;  Laterality: N/A;  . tumor removed     Abdomen (benign)    Current Outpatient Prescriptions  Medication Sig Dispense Refill  .  amLODipine (NORVASC) 10 MG tablet TAKE 1 TABLET BY MOUTH EVERY DAY 90 tablet 2  . aspirin 81 MG tablet Take 81 mg by mouth daily.    Alexandra Gross Kitchen atorvastatin (LIPITOR) 40 MG tablet Take 1 tablet (40 mg total) by mouth daily at 6 PM. 30 tablet 2  . clopidogrel (PLAVIX) 75 MG tablet TAKE 1 TABLET (75 MG TOTAL) BY MOUTH DAILY. 30 tablet 4  . DUREZOL 0.05 % EMUL Place 1 drop into the left eye daily.  1  . isosorbide mononitrate (IMDUR) 60 MG 24 hr tablet TAKE 1.5 TABLETS (90 MG TOTAL) BY MOUTH DAILY. 45 tablet 3  . metFORMIN (GLUCOPHAGE) 500 MG tablet TAKE 1 TABLET (500 MG TOTAL) BY MOUTH DAILY WITH BREAKFAST. 90 tablet 2  . metoprolol succinate (TOPROL XL) 25 MG 24 hr tablet Take 1 tablet (25 mg total) by mouth daily. Need appointment for additional refills 30 tablet 2  .  nitroGLYCERIN (NITROSTAT) 0.4 MG SL tablet PLACE 1 TABLET (0.4 MG TOTAL) UNDER THE TONGUE EVERY 5 (FIVE) MINUTES AS NEEDED FOR CHEST PAIN. 25 tablet 3  . Omega-3 Fatty Acids (FISH OIL) 1000 MG CAPS Take 1 capsule by mouth daily.    . vitamin B-12 (CYANOCOBALAMIN) 500 MCG tablet Take 1 tablet (500 mcg total) by mouth daily. 30 tablet 1  . VITAMIN E PO Take 1 capsule by mouth daily.      . furosemide (LASIX) 20 MG tablet Take 1 tablet (20 mg total) by mouth 2 (two) times a week. OK TO TAKE EXTRA FOR PRN SWELLING 15 tablet 3   No current facility-administered medications for this visit.     Allergies:   Lisinopril and Penicillins    Social History:  The patient  reports that she quit smoking about 23 years ago. Her smoking use included Cigarettes. She has never used smokeless tobacco. She reports that she does not drink alcohol or use drugs.   Family History:  The patient's family history includes Arthritis in her mother; Diabetes in her mother; Hypertension in her mother.    ROS:  Please see the history of present illness. All other systems are reviewed and negative.    PHYSICAL EXAM: VS:  BP (!) 144/68   Pulse 72   Ht _0  (1.575 m)   Wt 195 lb 12.8 oz (88.8 kg)   BMI 35.81 kg/m  , BMI Body mass index is 35.81 kg/m. GEN: Well nourished, well developed, female in no acute distress  HEENT: normal for age  Neck: JVD 9 cm, left carotid bruit, no masses Cardiac: RRR; soft murmur, no rubs, or gallops Respiratory: Decreased breath sounds bases with a few Rales bilaterally, normal work of breathing GI: soft, nontender, nondistended, + BS MS: no deformity or atrophy; trace edema; distal pulses are 2+ in all 4 extremities   Skin: warm and dry, no rash Neuro:  Strength and sensation are intact Psych: euthymic mood, full affect   EKG:  EKG is ordered today. The ekg ordered today demonstrates sinus rhythm, heart rate 72, no acute ischemic changes. Diffuse T-wave flattening is similar to  EKG on 09/20/2015  ECHO: 01/16/2015 - Left ventricle: The cavity size was normal. Wall thickness was   increased in a pattern of mild LVH. The estimated ejection   fraction was 55%. Wall motion was normal; there were no regional   wall motion abnormalities. Doppler parameters are consistent with   abnormal left ventricular relaxation (grade 1 diastolic dysfunction). - Aortic valve: There was no stenosis. -  Mitral valve: Mildly calcified annulus. Mildly calcified leaflets   . There was no significant regurgitation. - Left atrium: The atrium was mildly dilated. - Right ventricle: The cavity size was normal. Systolic function   was normal. - Pulmonary arteries: No complete TR doppler jet so unable to   estimate PA systolic pressure. - Inferior vena cava: The vessel was normal in size. The   respirophasic diameter changes were in the normal range (>= 50%),   consistent with normal central venous pressure. Impressions: - Normal LV size with mild LV hypertrophy. EF 55%. Normal RV size   and systolic function. No significant valvular abnormalities.   Recent Labs: 06/23/2016: ALT 15; BUN 39; Creatinine, Ser 1.61; Hemoglobin 11.3; Platelets 260.0; Potassium 4.6; Sodium 138; TSH 2.53    Lipid Panel    Component Value Date/Time   CHOL 218 (H) 06/23/2016 1508   TRIG 152.0 (H) 06/23/2016 1508   HDL 50.80 06/23/2016 1508   CHOLHDL 4 06/23/2016 1508   VLDL 30.4 06/23/2016 1508   LDLCALC 137 (H) 06/23/2016 1508   LDLDIRECT 66.0 12/19/2014 1620     Wt Readings from Last 3 Encounters:  10/22/16 195 lb 12.8 oz (88.8 kg)  06/23/16 199 lb (90.3 kg)  12/24/15 193 lb (87.5 kg)     Other studies Reviewed: Additional studies/ records that were reviewed today include: Office notes, hospital records and testing.  ASSESSMENT AND PLAN:  1.  CAD: She is not having ischemic symptoms. She is to continue the baby aspirin, Imdur. We will restart the Toprol-XL and the sublingual nitroglycerin. I will  hold off on the Plavix because of her reports of bloody stools.  2. Bright red blood per rectum: She has never had a colonoscopy. She has never been seen by GI. We will refer her to a GI doctor for evaluation.  3. Hyperlipidemia: Her cholesterol was elevated in May with her LDL well above target at 137. She will be restarted on a statin at 40 mg daily. Her hepatic function has been normal in the past.  4. Dyspnea on exertion: This is likely secondary to Diastolic CHF, she has mild volume overload on exam. I will add Lasix twice a week and it is okay to take extra as needed for swelling. Check a BMET today  5. Chronic kidney disease: We will check BMET today.  6. Compliance: She is encouraged to get help filling her med boxes weekly so that she does not miss any medications. Hopefully, her family can help with her care.   Current medicines are reviewed at length with the patient today.  The patient has concerns regarding medicines. Concerns were addressed  The following changes have been made:  Restart Lipitor to slightly lower dose, Toprol-XL and nitroglycerin, okay to stay off the Plavix for now.  Labs/ tests ordered today include:   Orders Placed This Encounter  Procedures  . Basic metabolic panel  . Ambulatory referral to Gastroenterology  . EKG 12-Lead  . ECHOCARDIOGRAM COMPLETE     Disposition:   FU with Alexandra Gross  Signed, Alexandra Ferries, PA-C  10/22/2016 3:14 PM    [place] Phone: [phone]; Fax: [phone]  This note was written with the assistance of speech recognition software. Please excuse any transcriptional errors.

## 2016-10-23 ENCOUNTER — Encounter: Payer: Self-pay | Admitting: Gastroenterology

## 2016-10-23 LAB — BASIC METABOLIC PANEL
BUN/Creatinine Ratio: 19 (ref 12–28)
BUN: 27 mg/dL (ref 8–27)
CALCIUM: 9.6 mg/dL (ref 8.7–10.3)
CHLORIDE: 108 mmol/L — AB (ref 96–106)
CO2: 20 mmol/L (ref 20–29)
CREATININE: 1.44 mg/dL — AB (ref 0.57–1.00)
GFR calc Af Amer: 39 mL/min/{1.73_m2} — ABNORMAL LOW (ref >59)
GFR calc non Af Amer: 34 mL/min/{1.73_m2} — ABNORMAL LOW (ref >59)
GLUCOSE: 107 mg/dL — AB (ref 65–99)
Potassium: 5.7 mmol/L — ABNORMAL HIGH (ref 3.5–5.2)
Sodium: 145 mmol/L — ABNORMAL HIGH (ref 134–144)

## 2016-10-29 ENCOUNTER — Other Ambulatory Visit: Payer: Self-pay | Admitting: Physician Assistant

## 2016-10-29 ENCOUNTER — Other Ambulatory Visit: Payer: Self-pay

## 2016-10-29 ENCOUNTER — Other Ambulatory Visit: Payer: Medicare Other | Admitting: *Deleted

## 2016-10-29 ENCOUNTER — Ambulatory Visit (HOSPITAL_COMMUNITY): Payer: Medicare Other | Attending: Cardiovascular Disease

## 2016-10-29 DIAGNOSIS — I051 Rheumatic mitral insufficiency: Secondary | ICD-10-CM | POA: Insufficient documentation

## 2016-10-29 DIAGNOSIS — N183 Chronic kidney disease, stage 3 unspecified: Secondary | ICD-10-CM

## 2016-10-29 DIAGNOSIS — I5030 Unspecified diastolic (congestive) heart failure: Secondary | ICD-10-CM | POA: Insufficient documentation

## 2016-10-29 DIAGNOSIS — R0609 Other forms of dyspnea: Secondary | ICD-10-CM

## 2016-10-29 LAB — BASIC METABOLIC PANEL
BUN / CREAT RATIO: 19 (ref 12–28)
BUN: 33 mg/dL — AB (ref 8–27)
CHLORIDE: 106 mmol/L (ref 96–106)
CO2: 19 mmol/L — ABNORMAL LOW (ref 20–29)
Calcium: 9.2 mg/dL (ref 8.7–10.3)
Creatinine, Ser: 1.74 mg/dL — ABNORMAL HIGH (ref 0.57–1.00)
GFR calc non Af Amer: 27 mL/min/{1.73_m2} — ABNORMAL LOW (ref >59)
GFR, EST AFRICAN AMERICAN: 31 mL/min/{1.73_m2} — AB (ref >59)
GLUCOSE: 141 mg/dL — AB (ref 65–99)
POTASSIUM: 5 mmol/L (ref 3.5–5.2)
SODIUM: 139 mmol/L (ref 134–144)

## 2016-11-03 ENCOUNTER — Ambulatory Visit (INDEPENDENT_AMBULATORY_CARE_PROVIDER_SITE_OTHER): Payer: Medicare Other | Admitting: Gastroenterology

## 2016-11-03 ENCOUNTER — Encounter: Payer: Self-pay | Admitting: Gastroenterology

## 2016-11-03 VITALS — BP 154/60 | HR 80 | Ht 61.25 in | Wt 198.2 lb

## 2016-11-03 DIAGNOSIS — I2510 Atherosclerotic heart disease of native coronary artery without angina pectoris: Secondary | ICD-10-CM

## 2016-11-03 DIAGNOSIS — K921 Melena: Secondary | ICD-10-CM

## 2016-11-03 DIAGNOSIS — Z7901 Long term (current) use of anticoagulants: Secondary | ICD-10-CM

## 2016-11-03 DIAGNOSIS — D649 Anemia, unspecified: Secondary | ICD-10-CM | POA: Diagnosis not present

## 2016-11-03 NOTE — Patient Instructions (Signed)
It has been recommended to you by your physician that you have a colonoscopy completed. Per your request, we did not schedule the procedure(s) today. Please contact our office at [phone] when you decide to have the procedure completed. You will have to have someone bring you, stay with you and bring you home.    If you are age 81 or older, your body mass index should be between 23-30. Your Body mass index is 37.15 kg/m. If this is out of the aforementioned range listed, please consider follow up with your Primary Care Provider.  If you are age 27 or younger, your body mass index should be between 19-25. Your Body mass index is 37.15 kg/m. If this is out of the aformentioned range listed, please consider follow up with your Primary Care Provider.   Thank you.

## 2016-11-03 NOTE — Progress Notes (Signed)
HPI :  83 y/o female with a history of CAD, DM, obesity, here for a new patient consultation per Dr. Cathlean Cower for blood in the stools and anemia.   She developed blood in her stools. This occurred about about a month ago. She saw it for a few days. She was seeing red blood in the stools, turned the water red. No abdominal or rectal pain. She has not noticed in the past few weeks. She has seen blood in her stools remotely in the past, hard time telling how frequently, but this does occur from time to time. She has never had a prior colonoscopy. No prior colon cancer in the family. She is having a bowel movement once or twice per day. No constipation or diarrhea. No abdominal pains.   She takes aspirin for her heart. History of CAD with an NSTEMI in 2016 with medical therapy for CFX 90%, OM 95% (PCI would be high risk), she is not having any chest pains or shortness of breath. Cardiology holding plavix due to rectal bleeding.  Echocardiogram 10/29/16 - EF 71-21%, grade I diastolic dysfunction.  She has a mild anemia, Hgb 11.3 in 06/23/16.    Past Medical History:  Diagnosis Date  . Arthritis   . B12 deficiency   . CAD (coronary artery disease)    a. 12/2010 Cath: nonobs dzs;  b. 12/2014 NSTEMI/Cath: LM nl, LAD nl, LCX 90p, 38m OM1 95ost, OM2 small, RCA 50p/d-->PCI of complex LCX/OM lesion considered, felt to be high risk-->Med Rx.  . CHF (congestive heart failure) ([place]   . Chronic kidney disease   . Diabetes mellitus   . [place]   . History of echocardiogram    a.  Echo 11/16: mild LVH, EF 55%, no RWMA, Gr 1 DD, MAC, mild LAE, normal RVF  . Hyperlipidemia   . Hypertension   . Obesity      Past Surgical History:  Procedure Laterality Date  . ABDOMINAL HYSTERECTOMY  1972  . APPENDECTOMY    . BACK SURGERY    . CARDIAC CATHETERIZATION N/A 12/31/2014   Procedure: Left Heart Cath and Coronary Angiography;  Surgeon: Peter M JMartinique MD;  [facility];  Service:  Cardiovascular;  Laterality: N/A;  . CATARACT EXTRACTION Bilateral   . LEFT HEART CATHETERIZATION WITH CORONARY ANGIOGRAM N/A 01/06/2011   Procedure: LEFT HEART CATHETERIZATION WITH CORONARY ANGIOGRAM;  Surgeon: JMinus Breeding MD;  [facility];  Service: Cardiovascular;  Laterality: N/A;  . tumor removed     Abdomen (benign)   Family History  Problem Relation Age of Onset  . Diabetes Mother   . Arthritis Mother   . Hypertension Mother   . Heart disease Mother   . Stroke Maternal Grandmother   . Stroke Maternal Grandfather    Social History  Substance Use Topics  . Smoking status: Former Smoker    Types: Cigarettes    Quit date: 02/23/1993  . Smokeless tobacco: Never Used  . Alcohol use No   Current Outpatient Prescriptions  Medication Sig Dispense Refill  . amLODipine (NORVASC) 10 MG tablet TAKE 1 TABLET BY MOUTH EVERY DAY 90 tablet 2  . aspirin 81 MG tablet Take 81 mg by mouth daily.    .Marland Kitchenatorvastatin (LIPITOR) 40 MG tablet Take 1 tablet (40 mg total) by mouth daily at 6 PM. 30 tablet 2  . furosemide (LASIX) 20 MG tablet Take 1 tablet (20 mg total) by mouth 2 (two) times a week.  OK TO TAKE EXTRA FOR PRN SWELLING 15 tablet 3  . isosorbide mononitrate (IMDUR) 60 MG 24 hr tablet TAKE 1.5 TABLETS (90 MG TOTAL) BY MOUTH DAILY. 45 tablet 3  . metFORMIN (GLUCOPHAGE) 500 MG tablet TAKE 1 TABLET (500 MG TOTAL) BY MOUTH DAILY WITH BREAKFAST. 90 tablet 2  . metoprolol succinate (TOPROL XL) 25 MG 24 hr tablet Take 1 tablet (25 mg total) by mouth daily. Need appointment for additional refills 30 tablet 2  . Omega-3 Fatty Acids (FISH OIL) 1000 MG CAPS Take 1 capsule by mouth daily.    Vladimir Faster Glycol-Propyl Glycol (SYSTANE ULTRA OP) Place 1 drop into both eyes 2 (two) times daily.    . vitamin B-12 (CYANOCOBALAMIN) 500 MCG tablet Take 1 tablet (500 mcg total) by mouth daily. 30 tablet 1  . VITAMIN E PO Take 1 capsule by mouth daily.      . nitroGLYCERIN (NITROSTAT) 0.4 MG SL  tablet PLACE 1 TABLET (0.4 MG TOTAL) UNDER THE TONGUE EVERY 5 (FIVE) MINUTES AS NEEDED FOR CHEST PAIN. (Patient not taking: Reported on 11/03/2016) 25 tablet 3   No current facility-administered medications for this visit.    Allergies  Allergen Reactions  . Lisinopril Other (See Comments)    unknown  . Penicillins Other (See Comments)     Review of Systems: All systems reviewed and negative except where noted in HPI.   Lab Results  Component Value Date   WBC 11.3 (H) 06/23/2016   HGB 11.3 (L) 06/23/2016   HCT 34.9 (L) 06/23/2016   MCV 87.7 06/23/2016   PLT 260.0 06/23/2016    Lab Results  Component Value Date   CREATININE 1.74 (H) 10/29/2016   BUN 33 (H) 10/29/2016   NA 139 10/29/2016   K 5.0 10/29/2016   CL 106 10/29/2016   CO2 19 (L) 10/29/2016    Lab Results  Component Value Date   ALT 15 06/23/2016   AST 14 06/23/2016   ALKPHOS 60 06/23/2016   BILITOT 0.4 06/23/2016     Physical Exam: BP (!) 154/60 (BP Location: Left Arm, Patient Position: Sitting, Cuff Size: Normal)   Pulse 80   Ht 5' 1.25" (1.556 m) Comment: height measured without shoes  Wt 198 lb 4 oz (89.9 kg)   BMI 37.15 kg/m  Constitutional: Pleasant, female in no acute distress. HEENT: Normocephalic and atraumatic. Conjunctivae are normal. No scleral icterus. Neck supple.  Cardiovascular: Normal rate, regular rhythm.  Pulmonary/chest: Effort normal and breath sounds normal. No wheezing, rales or rhonchi. Abdominal: Soft, protuberant, nontender. There are no masses palpable. No hepatomegaly. Extremities: no edema Lymphadenopathy: No cervical adenopathy noted. Neurological: Alert and oriented to person place and time. Skin: Skin is warm and dry. No rashes noted. Psychiatric: Normal mood and affect. Behavior is normal.   ASSESSMENT AND PLAN: 83 year old female with history of coronary artery disease being treated with aspirin and Plavix, who is referred for rectal bleeding and anemia.    Patient has never had a colonoscopy. Cardiology is holding her Plavix given her recent rectal bleeding. Her echocardiogram was done a few days ago with findings as above, she's cleared for colonoscopy to further evaluate her symptoms. Having a colonoscopy is reasonable given her bleeding, anemia, and further need for Plavix. We discussed this issue at length in regards to whether or not the patient wanted a colonoscopy given her comorbidities. I discussed risks and benefits of colonoscopy, following this discussion she wanted to proceed with it. Further recommendations pending the result  Luttrell Cellar, MD [facility] Pager [phone]  CC: Biagio Borg, MD

## 2016-11-04 ENCOUNTER — Encounter: Payer: Self-pay | Admitting: Gastroenterology

## 2016-12-16 NOTE — Progress Notes (Signed)
Cardiology Office Note   Date:  12/17/2016   ID:  SEVEN MARENGO, DOB [birth year], MRN [id number]  PCP:  Biagio Borg, MD  Cardiologist:   Minus Breeding, MD   Chief Complaint  Patient presents with  . Coronary Artery Disease  . Shortness of Breath      History of Present Illness: Alexandra Gross is a 83 y.o. female who presents for follow-up of NSTEMI.   The patient does have single-vessel CAD as described. However, because this was high risk she was managed medically.  She was having bloody stools by her report at the last appt.  She is not on Plavix because of this.  She was referred to GI.   Colonoscopy is planned.     She reports that since I last saw her she is doing relatively well. She tries to walk up the driveway for exercise. She doesn't get any chest discomfort but she does get short of breath. This however seems to be stable. She can make it to the top then she sits on the stairs and relaxes. She does not get any resting shortness of breath, PND or orthopnea. She does not require nitroglycerin. She does not have any chest pressure, neck or arm discomfort. She doesn't describe palpitations, presyncope or syncope.  Of note she reports she's not been getting any bloody stools recently.   Past Medical History:  Diagnosis Date  . Arthritis   . B12 deficiency   . CAD (coronary artery disease)    a. 12/2010 Cath: nonobs dzs;  b. 12/2014 NSTEMI/Cath: LM nl, LAD nl, LCX 90p, 28m OM1 95ost, OM2 small, RCA 50p/d-->PCI of complex LCX/OM lesion considered, felt to be high risk-->Med Rx.  . CH[place] (congestive heart failure) ([place]   . Chronic kidney disease   . Diabetes mellitus   . Heart attack ([place]   . History of echocardiogram    a.  Echo 11/16: mild LVH, EF 55%, no RWMA, Gr 1 DD, MAC, mild LAE, normal RVF  . Hyperlipidemia   . Hypertension   . Obesity     Past Surgical History:  Procedure Laterality Date  . ABDOMINAL HYSTERECTOMY  1972  . APPENDECTOMY    . BACK SURGERY      . CARDIAC CATHETERIZATION N/A 12/31/2014   Procedure: Left Heart Cath and Coronary Angiography;  Surgeon: Peter M JMartinique MD;  [facility];  Service: Cardiovascular;  Laterality: N/A;  . CATARACT EXTRACTION Bilateral   . LEFT HEART CATHETERIZATION WITH CORONARY ANGIOGRAM N/A 01/06/2011   Procedure: LEFT HEART CATHETERIZATION WITH CORONARY ANGIOGRAM;  Surgeon: JMinus Breeding MD;  [facility];  Service: Cardiovascular;  Laterality: N/A;  . tumor removed     Abdomen (benign)     Current Outpatient Prescriptions  Medication Sig Dispense Refill  . amLODipine (NORVASC) 10 MG tablet TAKE 1 TABLET BY MOUTH EVERY DAY 90 tablet 2  . aspirin 81 MG tablet Take 81 mg by mouth daily.    .Marland Kitchenatorvastatin (LIPITOR) 40 MG tablet Take 1 tablet (40 mg total) by mouth daily at 6 PM. 30 tablet 2  . furosemide (LASIX) 20 MG tablet Take 1 tablet (20 mg total) by mouth 2 (two) times a week. OK TO TAKE EXTRA FOR PRN SWELLING 15 tablet 3  . isosorbide mononitrate (IMDUR) 60 MG 24 hr tablet TAKE 1.5 TABLETS (90 MG TOTAL) BY MOUTH DAILY. 45 tablet 3  . metFORMIN (GLUCOPHAGE) 500 MG tablet TAKE 1 TABLET (500  MG TOTAL) BY MOUTH DAILY WITH BREAKFAST. 90 tablet 2  . metoprolol succinate (TOPROL-XL) 50 MG 24 hr tablet Take 1 tablet (50 mg total) by mouth daily. 90 tablet 3  . nitroGLYCERIN (NITROSTAT) 0.4 MG SL tablet PLACE 1 TABLET (0.4 MG TOTAL) UNDER THE TONGUE EVERY 5 (FIVE) MINUTES AS NEEDED FOR CHEST PAIN. 25 tablet 3  . Omega-3 Fatty Acids (FISH OIL) 1000 MG CAPS Take 1 capsule by mouth daily.    Vladimir Faster Glycol-Propyl Glycol (SYSTANE ULTRA OP) Place 1 drop into both eyes 2 (two) times daily.    . vitamin B-12 (CYANOCOBALAMIN) 500 MCG tablet Take 1 tablet (500 mcg total) by mouth daily. 30 tablet 1  . VITAMIN E PO Take 1 capsule by mouth daily.       No current facility-administered medications for this visit.     Allergies:   Lisinopril and Penicillins    ROS:  Please see the  history of present illness.   Otherwise, review of systems are positive for none.   All other systems are reviewed and negative.    PHYSICAL EXAM: VS:  BP (!) 186/66   Pulse 85   Ht _0  (1.575 m)   Wt 205 lb (93 kg)   SpO2 96%   BMI 37.49 kg/m  , BMI Body mass index is 37.49 kg/m.  GENERAL:  Well appearing NECK:  No jugular venous distention, waveform within normal limits, carotid upstroke brisk and symmetric, no bruits, no thyromegaly LUNGS:  Clear to auscultation bilaterally CHEST:  Unremarkable HEART:  PMI not displaced or sustained,S1 and S2 within normal limits, no S3, no S4, no clicks, no rubs, no murmurs ABD:  Flat, positive bowel sounds normal in frequency in pitch, no bruits, no rebound, no guarding, no midline pulsatile mass, no hepatomegaly, no splenomegaly EXT:  2 plus pulses upper and decreased DP/PT bilateral, no edema, no cyanosis no clubbing    EKG:  EKG not ordered today.   Recent Labs: 06/23/2016: ALT 15; Hemoglobin 11.3; Platelets 260.0; TSH 2.53 10/29/2016: BUN 33; Creatinine, Ser 1.74; Potassium 5.0; Sodium 139    Lipid Panel    Component Value Date/Time   CHOL 218 (H) 06/23/2016 1508   TRIG 152.0 (H) 06/23/2016 1508   HDL 50.80 06/23/2016 1508   CHOLHDL 4 06/23/2016 1508   VLDL 30.4 06/23/2016 1508   LDLCALC 137 (H) 06/23/2016 1508   LDLDIRECT 66.0 12/19/2014 1620   Lab Results  Component Value Date   HGBA1C 7.4 (H) 06/23/2016    Wt Readings from Last 3 Encounters:  12/17/16 205 lb (93 kg)  11/03/16 198 lb 4 oz (89.9 kg)  10/22/16 195 lb 12.8 oz (88.8 kg)     Other studies Reviewed: Additional studies/ records that were reviewed today include: Labs Review of the above records demonstrates:       ASSESSMENT AND PLAN:   CAD: She has chronic DOE.  We will continue with medical management.   HTN: Her BP is too high.  I think she takes her meds.  I will increase the Toprol XL to 50 mg.  She will likely need more med titration.    Hyperlipidemia: I will have her come back for a fasting lipid.  She was started on Lipitor at the last visit.   CKD: Stage 3.  I will repeat a BMET. Creat was up slightly as above.   CAROTID:    Moderate R carotid stenosis and mild L carotid stenosis. Continue aspirin, plavix, and statin. Repeat  in 1 year. This was done in July  Diabetes: A1C as above.    FU with PCP.  GI BLEED:  She has colonoscopy scheduled.   Current medicines are reviewed at length with the patient today.  The patient does not have concerns regarding medicines.  The following changes have been made:  As above.   Labs/ tests ordered today include:     BMET, LIPIDS   Disposition:   FU with Rhonda Barrett, PAc in 2 months.    Signed, Minus Breeding, MD  12/17/2016 11:44 AM    [facility]

## 2016-12-17 ENCOUNTER — Ambulatory Visit (INDEPENDENT_AMBULATORY_CARE_PROVIDER_SITE_OTHER): Payer: Medicare Other | Admitting: Cardiology

## 2016-12-17 ENCOUNTER — Encounter: Payer: Self-pay | Admitting: Cardiology

## 2016-12-17 VITALS — BP 186/66 | HR 85 | Ht 62.0 in | Wt 205.0 lb

## 2016-12-17 DIAGNOSIS — E785 Hyperlipidemia, unspecified: Secondary | ICD-10-CM | POA: Diagnosis not present

## 2016-12-17 DIAGNOSIS — E118 Type 2 diabetes mellitus with unspecified complications: Secondary | ICD-10-CM | POA: Diagnosis not present

## 2016-12-17 DIAGNOSIS — R0609 Other forms of dyspnea: Secondary | ICD-10-CM | POA: Diagnosis not present

## 2016-12-17 DIAGNOSIS — I6523 Occlusion and stenosis of bilateral carotid arteries: Secondary | ICD-10-CM | POA: Diagnosis not present

## 2016-12-17 DIAGNOSIS — Z79899 Other long term (current) drug therapy: Secondary | ICD-10-CM

## 2016-12-17 DIAGNOSIS — I2510 Atherosclerotic heart disease of native coronary artery without angina pectoris: Secondary | ICD-10-CM

## 2016-12-17 MED ORDER — METOPROLOL SUCCINATE ER 50 MG PO TB24: 50.0000 mg | ORAL_TABLET | Freq: Every day | ORAL | 3 refills | Status: AC

## 2016-12-17 NOTE — Patient Instructions (Addendum)
Medication Instructions:  INCREASE- Metoprolol 50 mg daily  If you need a refill on your cardiac medications before your next appointment, please call your pharmacy.  Labwork: CMP and Fasting Lipids HERE IN OUR OFFICE AT [facility]  Testing/Procedures: None Ordered   Follow-Up: Your physician wants you to follow-up in: 2 Months with Rosaria Ferries.    Thank you for choosing [facility] at [facility]!!

## 2016-12-21 ENCOUNTER — Other Ambulatory Visit: Payer: Self-pay | Admitting: Internal Medicine

## 2016-12-21 ENCOUNTER — Other Ambulatory Visit (INDEPENDENT_AMBULATORY_CARE_PROVIDER_SITE_OTHER): Payer: Medicare Other

## 2016-12-21 ENCOUNTER — Encounter: Payer: Self-pay | Admitting: Internal Medicine

## 2016-12-21 DIAGNOSIS — E1121 Type 2 diabetes mellitus with diabetic nephropathy: Secondary | ICD-10-CM | POA: Diagnosis not present

## 2016-12-21 LAB — LIPID PANEL
CHOLESTEROL: 129 mg/dL (ref 0–200)
HDL: 44.2 mg/dL (ref >39.00)
LDL CALC: 61 mg/dL (ref 0–99)
NonHDL: 84.61
TRIGLYCERIDES: 117 mg/dL (ref 0.0–149.0)
Total CHOL/HDL Ratio: 3
VLDL: 23.4 mg/dL (ref 0.0–40.0)

## 2016-12-21 LAB — HEPATIC FUNCTION PANEL
ALBUMIN: 4.1 g/dL (ref 3.5–5.2)
ALT: 32 U/L (ref 0–35)
AST: 32 U/L (ref 0–37)
Alkaline Phosphatase: 72 U/L (ref 39–117)
BILIRUBIN TOTAL: 0.8 mg/dL (ref 0.2–1.2)
Bilirubin, Direct: 0.2 mg/dL (ref 0.0–0.3)
TOTAL PROTEIN: 7.7 g/dL (ref 6.0–8.3)

## 2016-12-21 LAB — BASIC METABOLIC PANEL
BUN: 28 mg/dL — AB (ref 6–23)
CO2: 23 mEq/L (ref 19–32)
CREATININE: 1.47 mg/dL — AB (ref 0.40–1.20)
Calcium: 9.4 mg/dL (ref 8.4–10.5)
Chloride: 105 mEq/L (ref 96–112)
GFR: 43.61 mL/min — AB (ref >60.00)
Glucose, Bld: 270 mg/dL — ABNORMAL HIGH (ref 70–99)
POTASSIUM: 4.3 meq/L (ref 3.5–5.1)
Sodium: 138 mEq/L (ref 135–145)

## 2016-12-21 LAB — HEMOGLOBIN A1C: HEMOGLOBIN A1C: 8 % — AB (ref 4.6–6.5)

## 2016-12-21 MED ORDER — METFORMIN HCL 500 MG PO TABS: 1000.0000 mg | ORAL_TABLET | Freq: Every day | ORAL | 3 refills | Status: DC

## 2016-12-24 ENCOUNTER — Ambulatory Visit (INDEPENDENT_AMBULATORY_CARE_PROVIDER_SITE_OTHER): Payer: Medicare Other | Admitting: Internal Medicine

## 2016-12-24 ENCOUNTER — Encounter: Payer: Self-pay | Admitting: Internal Medicine

## 2016-12-24 VITALS — BP 146/82 | HR 74 | Temp 98.3°F | Ht 62.0 in | Wt 203.0 lb

## 2016-12-24 DIAGNOSIS — E1121 Type 2 diabetes mellitus with diabetic nephropathy: Secondary | ICD-10-CM | POA: Diagnosis not present

## 2016-12-24 DIAGNOSIS — N183 Chronic kidney disease, stage 3 unspecified: Secondary | ICD-10-CM

## 2016-12-24 DIAGNOSIS — I10 Essential (primary) hypertension: Secondary | ICD-10-CM

## 2016-12-24 MED ORDER — METFORMIN HCL ER 500 MG PO TB24: 1000.0000 mg | ORAL_TABLET | Freq: Two times a day (BID) | ORAL | 3 refills | Status: DC

## 2016-12-24 NOTE — Patient Instructions (Signed)
Ok to change the metformin to 2 pills twice per day  Please continue all other medications as before, and refills have been done if requested.  Please have the pharmacy call with any other refills you may need.  Please continue your efforts at being more active, low cholesterol diet, and weight control.  Please keep your appointments with your specialists as you may have planned  Please return in 6 months, or sooner if needed, with Lab testing done 3-5 days before

## 2016-12-24 NOTE — Progress Notes (Signed)
Subjective:    Patient ID: Alexandra Gross, female    DOB: [birth year], 83 y.o.   MRN: [id number]  HPI  Here to f/u; overall doing ok,  Pt denies chest pain, increasing sob or doe, wheezing, orthopnea, PND, increased LE swelling, palpitations, dizziness or syncope.  Pt denies new neurological symptoms such as new headache, or facial or extremity weakness or numbness.  Pt denies polydipsia, polyuria, or low sugar episode.  Pt states overall good compliance with meds, mostly trying to follow appropriate diet, with wt overall stable.  Admits to eating candy more lately, plans to do better,and work on diet and more activity.  Wt Readings from Last 3 Encounters:  12/24/16 203 lb (92.1 kg)  12/17/16 205 lb (93 kg)  11/03/16 198 lb 4 oz (89.9 kg)  Grandson has moved in with her and helping quite a bit. States having worsening memory and has to write just about everything down to keep track of it.   Pt denies fever, wt loss, night sweats, loss of appetite, or other constitutional symptoms Past Medical History:  Diagnosis Date  . Arthritis   . B12 deficiency   . CAD (coronary artery disease)    a. 12/2010 Cath: nonobs dzs;  b. 12/2014 NSTEMI/Cath: LM nl, LAD nl, LCX 90p, 21m OM1 95ost, OM2 small, RCA 50p/d-->PCI of complex LCX/OM lesion considered, felt to be high risk-->Med Rx.  . [place]   . Chronic kidney disease   . Diabetes mellitus   . Heart attack ([facility] 12/[place]  . History of echocardiogram    a.  Echo 11/16: mild LVH, EF 55%, no RWMA, Gr 1 DD, MAC, mild LAE, normal RVF  . Hyperlipidemia   . Hypertension   . Obesity    Past Surgical History:  Procedure Laterality Date  . ABDOMINAL HYSTERECTOMY  1972  . APPENDECTOMY    . BACK SURGERY    . CARDIAC CATHETERIZATION N/A 12/31/2014   Procedure: Left Heart Cath and Coronary Angiography;  Surgeon: Peter M JMartinique MD;  [facility];  Service: Cardiovascular;  Laterality: N/A;  . CATARACT EXTRACTION  Bilateral   . LEFT HEART CATHETERIZATION WITH CORONARY ANGIOGRAM N/A 01/06/2011   Procedure: LEFT HEART CATHETERIZATION WITH CORONARY ANGIOGRAM;  Surgeon: JMinus Breeding MD;  [facility];  Service: Cardiovascular;  Laterality: N/A;  . tumor removed     Abdomen (benign)    reports that she quit smoking about 23 years ago. Her smoking use included Cigarettes. She has never used smokeless tobacco. She reports that she does not drink alcohol or use drugs. family history includes Arthritis in her mother; Diabetes in her mother; Heart disease in her mother; Hypertension in her mother; Stroke in her maternal grandfather and maternal grandmother. Allergies  Allergen Reactions  . Lisinopril Other (See Comments)    unknown  . Penicillins Other (See Comments)   Current Outpatient Prescriptions on File Prior to Visit  Medication Sig Dispense Refill  . amLODipine (NORVASC) 10 MG tablet TAKE 1 TABLET BY MOUTH EVERY DAY 90 tablet 2  . aspirin 81 MG tablet Take 81 mg by mouth daily.    .Marland Kitchenatorvastatin (LIPITOR) 40 MG tablet Take 1 tablet (40 mg total) by mouth daily at 6 PM. 30 tablet 2  . furosemide (LASIX) 20 MG tablet Take 1 tablet (20 mg total) by mouth 2 (two) times a week. OK TO TAKE EXTRA FOR PRN SWELLING 15 tablet 3  . isosorbide mononitrate (IMDUR) 60  MG 24 hr tablet TAKE 1.5 TABLETS (90 MG TOTAL) BY MOUTH DAILY. 45 tablet 3  . metoprolol succinate (TOPROL-XL) 50 MG 24 hr tablet Take 1 tablet (50 mg total) by mouth daily. 90 tablet 3  . nitroGLYCERIN (NITROSTAT) 0.4 MG SL tablet PLACE 1 TABLET (0.4 MG TOTAL) UNDER THE TONGUE EVERY 5 (FIVE) MINUTES AS NEEDED FOR CHEST PAIN. 25 tablet 3  . Omega-3 Fatty Acids (FISH OIL) 1000 MG CAPS Take 1 capsule by mouth daily.    Vladimir Faster Glycol-Propyl Glycol (SYSTANE ULTRA OP) Place 1 drop into both eyes 2 (two) times daily.    . vitamin B-12 (CYANOCOBALAMIN) 500 MCG tablet Take 1 tablet (500 mcg total) by mouth daily. 30 tablet 1  . VITAMIN E PO  Take 1 capsule by mouth daily.       No current facility-administered medications on file prior to visit.    Review of Systems  Constitutional: Negative for other unusual diaphoresis or sweats HENT: Negative for ear discharge or swelling Eyes: Negative for other worsening visual disturbances Respiratory: Negative for stridor or other swelling  Gastrointestinal: Negative for worsening distension or other blood Genitourinary: Negative for retention or other urinary change Musculoskeletal: Negative for other MSK pain or swelling Skin: Negative for color change or other new lesions Neurological: Negative for worsening tremors and other numbness  Psychiatric/Behavioral: Negative for worsening agitation or other fatigue All other system neg per pt    Objective:   Physical Exam BP (!) 146/82   Pulse 74   Temp 98.3 F (36.8 C) (Oral)   Ht 5' 2"  (1.575 m)   Wt 203 lb (92.1 kg)   SpO2 98%   BMI 37.13 kg/m  VS noted,  Constitutional: Pt appears in NAD HENT: Head: NCAT.  Right Ear: External ear normal.  Left Ear: External ear normal.  Eyes: . Pupils are equal, round, and reactive to light. Conjunctivae and EOM are normal Nose: without d/c or deformity Neck: Neck supple. Gross normal ROM Cardiovascular: Normal rate and regular rhythm.   Pulmonary/Chest: Effort normal and breath sounds without rales or wheezing.  Abd:  Soft, NT, ND, + BS, no organomegaly Neurological: Pt is alert. At baseline orientation, motor grossly intact Skin: Skin is warm. No rashes, other new lesions, no LE edema Psychiatric: Pt behavior is normal without agitation \ No other exam findings     Assessment & Plan:

## 2016-12-25 ENCOUNTER — Ambulatory Visit (AMBULATORY_SURGERY_CENTER): Payer: Self-pay

## 2016-12-25 VITALS — Ht 62.0 in | Wt 200.0 lb

## 2016-12-25 DIAGNOSIS — K625 Hemorrhage of anus and rectum: Secondary | ICD-10-CM

## 2016-12-25 MED ORDER — SUPREP BOWEL PREP KIT 17.5-3.13-1.6 GM/177ML PO SOLN: 1.0000 | Freq: Once | ORAL | 0 refills | Status: AC

## 2016-12-25 NOTE — Progress Notes (Signed)
No allergies to eggs or soy No diet meds No home oxygen No past problems with anesthesia  Declined emmi 

## 2016-12-26 NOTE — Assessment & Plan Note (Signed)
stable overall by history and exam, recent data reviewed with pt, and pt to continue medical treatment as before,  to f/u any worsening symptoms or concerns BP Readings from Last 3 Encounters:  12/24/16 (!) 146/82  12/17/16 (!) 186/66  11/03/16 (!) 154/60  pt declines increased BP med, states BP at home < 140/90

## 2016-12-26 NOTE — Assessment & Plan Note (Signed)
Stable exam, for f/u labs,  to f/u any worsening symptoms or concerns

## 2016-12-26 NOTE — Assessment & Plan Note (Addendum)
stable overall by history and exam, recent data reviewed with pt, and pt to continue medical treatment as before,  to f/u any worsening symptoms or concerns,  Lab Results  Component Value Date   HGBA1C 8.0 (H) 12/21/2016  mild uncontrolled - for increased metformin ER 500 to 2 pills qam

## 2016-12-29 ENCOUNTER — Encounter: Payer: Self-pay | Admitting: Gastroenterology

## 2017-01-08 ENCOUNTER — Encounter: Payer: Self-pay | Admitting: Gastroenterology

## 2017-01-08 ENCOUNTER — Ambulatory Visit (AMBULATORY_SURGERY_CENTER): Payer: Medicare Other | Admitting: Gastroenterology

## 2017-01-08 ENCOUNTER — Other Ambulatory Visit: Payer: Self-pay

## 2017-01-08 VITALS — BP 142/50 | HR 69 | Temp 96.7°F | Resp 11 | Ht 62.0 in | Wt 203.0 lb

## 2017-01-08 DIAGNOSIS — D123 Benign neoplasm of transverse colon: Secondary | ICD-10-CM

## 2017-01-08 DIAGNOSIS — K625 Hemorrhage of anus and rectum: Secondary | ICD-10-CM | POA: Diagnosis present

## 2017-01-08 MED ORDER — SODIUM CHLORIDE 0.9 % IV SOLN
500.0000 mL | INTRAVENOUS | Status: DC
Start: 2017-01-08 — End: 2017-01-08

## 2017-01-08 NOTE — Progress Notes (Signed)
Pt's states no medical or surgical changes since previsit or office visit. 

## 2017-01-08 NOTE — Op Note (Signed)
[facility] Patient Name: Alexandra Gross Procedure Date: 01/08/2017 10:55 AM MRN: [id number] Endoscopist: Remo Lipps P. Jessy Calixte MD, MD Age: 83 Referring MD:  Date of Birth: 01-10-1932 Gender: Female Account #: [id number] Procedure:                Colonoscopy Indications:              This is the patient's first colonoscopy,                            Hematochezia, anemia Medicines:                Monitored Anesthesia Care Procedure:                Pre-Anesthesia Assessment:                           - Prior to the procedure, a History and Physical                            was performed, and patient medications and                            allergies were reviewed. The patient's tolerance of                            previous anesthesia was also reviewed. The risks                            and benefits of the procedure and the sedation                            options and risks were discussed with the patient.                            All questions were answered, and informed consent                            was obtained. Prior Anticoagulants: The patient has                            taken no previous anticoagulant or antiplatelet                            agents. ASA Grade Assessment: III - A patient with                            severe systemic disease. After reviewing the risks                            and benefits, the patient was deemed in                            satisfactory condition to undergo the procedure.  After obtaining informed consent, the colonoscope                            was passed under direct vision. Throughout the                            procedure, the patient's blood pressure, pulse, and                            oxygen saturations were monitored continuously. The                            Colonoscope was introduced through the anus and                            advanced to the the cecum, identified by                             appendiceal orifice and ileocecal valve. The                            colonoscopy was performed without difficulty. The                            patient tolerated the procedure well. The quality                            of the bowel preparation was fair. The terminal                            ileum, ileocecal valve, appendiceal orifice, and                            rectum were photographed. Scope In: 10:59:33 AM Scope Out: 11:19:45 AM Scope Withdrawal Time: 0 hours 14 minutes 32 seconds  Total Procedure Duration: 0 hours 20 minutes 12 seconds  Findings:                 The perianal and digital rectal examinations were                            normal.                           A 5 mm polyp was found in the transverse colon. The                            polyp was sessile. The polyp was removed with a                            cold snare. Resection and retrieval were complete.                           Multiple small and large-mouthed diverticula were  found in the entire colon.                           A moderate amount of semi-liquid stool was found in                            the entire colon, making visualization difficult                            initially, worst in the right colon. Lavage of the                            area was performed using copious amounts of sterile                            water, resulting in clearance with fair                            visualization. No large polyps or mass lesions                            noted in the right colon, however flat or small                            polyps may not have been appreciated in this area.                           Internal hemorrhoids were found during retroflexion.                           The exam was otherwise without abnormality. Complications:            No immediate complications. Estimated blood loss:                             Minimal. Estimated Blood Loss:     Estimated blood loss was minimal. Impression:               - Preparation of the colon was fair, worst in the                            right colon. No large polyps or mass lesions                            appreciated in this area, however small or flat                            polyps may not have been appreciated.                           - One 5 mm polyp in the transverse colon, removed                            with a cold  snare. Resected and retrieved.                           - Diverticulosis in the entire examined colon.                           - Internal hemorrhoids.                           - The examination was otherwise normal.                           Overall, suspect hemorrhoids are the cause of the                            patient's bleeding, which she states has since                            resolved. No concerning pathology noted otherwise                            in the colon as outlined above in regards to large                            polyps or mass lesions. Recommendation:           - Patient has a contact number available for                            emergencies. The signs and symptoms of potential                            delayed complications were discussed with the                            patient. Return to normal activities tomorrow.                            Written discharge instructions were provided to the                            patient.                           - Resume previous diet.                           - Continue present medications.                           - Await pathology results.                           - If symptoms recur, please contact me for                            management  of hemorrhoids Carlota Raspberry. Murrell Dome MD, MD 01/08/2017 11:27:00 AM This report has been signed electronically.

## 2017-01-08 NOTE — Progress Notes (Signed)
Report given to PACU, vss 

## 2017-01-08 NOTE — Progress Notes (Signed)
Called to room to assist during endoscopic procedure.  Patient ID and intended procedure confirmed with present staff. Received instructions for my participation in the procedure from the performing physician.  

## 2017-01-08 NOTE — Patient Instructions (Signed)
HANDOUTS GIVEN: POLYPS, DIVERTICULOSIS AND HEMORRHOIDS   YOU HAD AN ENDOSCOPIC PROCEDURE TODAY AT THE [facility]:   Refer to the procedure report that was given to you for any specific questions about what was found during the examination.  If the procedure report does not answer your questions, please call your gastroenterologist to clarify.  If you requested that your care partner not be given the details of your procedure findings, then the procedure report has been included in a sealed envelope for you to review at your convenience later.  YOU SHOULD EXPECT: Some feelings of bloating in the abdomen. Passage of more gas than usual.  Walking can help get rid of the air that was put into your GI tract during the procedure and reduce the bloating. If you had a lower endoscopy (such as a colonoscopy or flexible sigmoidoscopy) you may notice spotting of blood in your stool or on the toilet paper. If you underwent a bowel prep for your procedure, you may not have a normal bowel movement for a few days.  Please Note:  You might notice some irritation and congestion in your nose or some drainage.  This is from the oxygen used during your procedure.  There is no need for concern and it should clear up in a day or so.  SYMPTOMS TO REPORT IMMEDIATELY:   Following lower endoscopy (colonoscopy or flexible sigmoidoscopy):  Excessive amounts of blood in the stool  Significant tenderness or worsening of abdominal pains  Swelling of the abdomen that is new, acute  Fever of 100F or higher   For urgent or emergent issues, a gastroenterologist can be reached at any hour by calling [phone].   DIET:  We do recommend a small meal at first, but then you may proceed to your regular diet.  Drink plenty of fluids but you should avoid alcoholic beverages for 24 hours.  ACTIVITY:  You should plan to take it easy for the rest of today and you should NOT DRIVE or use heavy machinery until tomorrow  (because of the sedation medicines used during the test).    FOLLOW UP: Our staff will call the number listed on your records the next business day following your procedure to check on you and address any questions or concerns that you may have regarding the information given to you following your procedure. If we do not reach you, we will leave a message.  However, if you are feeling well and you are not experiencing any problems, there is no need to return our call.  We will assume that you have returned to your regular daily activities without incident.  If any biopsies were taken you will be contacted by phone or by letter within the next 1-3 weeks.  Please call us at [phone] if you have not heard about the biopsies in 3 weeks.    SIGNATURES/CONFIDENTIALITY: You and/or your care partner have signed paperwork which will be entered into your electronic medical record.  These signatures attest to the fact that that the information above on your After Visit Summary has been reviewed and is understood.  Full responsibility of the confidentiality of this discharge information lies with you and/or your care-partner. 

## 2017-01-11 ENCOUNTER — Telehealth: Payer: Self-pay

## 2017-01-11 NOTE — Telephone Encounter (Signed)
  Follow up Call-  Call [name] number 01/08/2017  Post procedure Call [name] phone  # [phone]  Permission to leave phone message Yes  Some recent data might be hidden     Patient questions:  Do you have a fever, pain , or abdominal swelling? No. Pain Score  0 *  Have you tolerated food without any problems? Yes.    Have you been able to return to your normal activities? Yes.    Do you have any questions about your discharge instructions: Diet   No. Medications  No. Follow up visit  No.  Do you have questions or concerns about your Care? No.  Actions: * If pain score is 4 or above: No action needed, pain <4.

## 2017-01-13 ENCOUNTER — Encounter: Payer: Self-pay | Admitting: Gastroenterology

## 2017-01-18 ENCOUNTER — Other Ambulatory Visit: Payer: Self-pay | Admitting: *Deleted

## 2017-01-18 MED ORDER — FUROSEMIDE 20 MG PO TABS: 20.0000 mg | ORAL_TABLET | ORAL | 0 refills | Status: DC

## 2017-01-18 MED ORDER — ATORVASTATIN CALCIUM 40 MG PO TABS: 40.0000 mg | ORAL_TABLET | Freq: Every day | ORAL | 0 refills | Status: DC

## 2017-01-19 DIAGNOSIS — H40013 Open angle with borderline findings, low risk, bilateral: Secondary | ICD-10-CM | POA: Diagnosis not present

## 2017-01-19 DIAGNOSIS — H35371 Puckering of macula, right eye: Secondary | ICD-10-CM | POA: Diagnosis not present

## 2017-01-19 DIAGNOSIS — H353121 Nonexudative age-related macular degeneration, left eye, early dry stage: Secondary | ICD-10-CM | POA: Diagnosis not present

## 2017-01-19 DIAGNOSIS — H3589 Other specified retinal disorders: Secondary | ICD-10-CM | POA: Diagnosis not present

## 2017-01-19 DIAGNOSIS — H35033 Hypertensive retinopathy, bilateral: Secondary | ICD-10-CM | POA: Diagnosis not present

## 2017-01-27 ENCOUNTER — Other Ambulatory Visit: Payer: Self-pay | Admitting: *Deleted

## 2017-02-08 ENCOUNTER — Ambulatory Visit: Payer: Medicare Other | Admitting: Physician Assistant

## 2017-02-08 ENCOUNTER — Encounter: Payer: Self-pay | Admitting: Physician Assistant

## 2017-02-08 VITALS — BP 153/77 | HR 87 | Ht 63.0 in | Wt 202.4 lb

## 2017-02-08 DIAGNOSIS — N183 Chronic kidney disease, stage 3 unspecified: Secondary | ICD-10-CM

## 2017-02-08 DIAGNOSIS — I2510 Atherosclerotic heart disease of native coronary artery without angina pectoris: Secondary | ICD-10-CM

## 2017-02-08 DIAGNOSIS — R0609 Other forms of dyspnea: Secondary | ICD-10-CM

## 2017-02-08 DIAGNOSIS — I10 Essential (primary) hypertension: Secondary | ICD-10-CM | POA: Diagnosis not present

## 2017-02-08 DIAGNOSIS — I5032 Chronic diastolic (congestive) heart failure: Secondary | ICD-10-CM | POA: Diagnosis not present

## 2017-02-08 LAB — BASIC METABOLIC PANEL
BUN / CREAT RATIO: 15 (ref 12–28)
BUN: 23 mg/dL (ref 8–27)
CO2: 21 mmol/L (ref 20–29)
Calcium: 9.2 mg/dL (ref 8.7–10.3)
Chloride: 106 mmol/L (ref 96–106)
Creatinine, Ser: 1.54 mg/dL — ABNORMAL HIGH (ref 0.57–1.00)
GFR calc Af Amer: 36 mL/min/{1.73_m2} — ABNORMAL LOW (ref >59)
GFR calc non Af Amer: 31 mL/min/{1.73_m2} — ABNORMAL LOW (ref >59)
GLUCOSE: 110 mg/dL — AB (ref 65–99)
Potassium: 4.9 mmol/L (ref 3.5–5.2)
SODIUM: 141 mmol/L (ref 134–144)

## 2017-02-08 LAB — CBC
Hematocrit: 33.4 % — ABNORMAL LOW (ref 34.0–46.6)
Hemoglobin: 11.5 g/dL (ref 11.1–15.9)
MCH: 27.7 pg (ref 26.6–33.0)
MCHC: 34.4 g/dL (ref 31.5–35.7)
MCV: 81 fL (ref 79–97)
PLATELETS: 194 10*3/uL (ref 150–379)
RBC: 4.15 x10E6/uL (ref 3.77–5.28)
RDW: 13.8 % (ref 12.3–15.4)
WBC: 9.5 10*3/uL (ref 3.4–10.8)

## 2017-02-08 NOTE — Progress Notes (Signed)
Cardiology Office Note   Date:  02/08/2017   ID:  Alexandra Gross, DOB [birth year], MRN [id number]  PCP:  Biagio Borg, MD  Cardiologist: Dr. Percival Spanish, 08/20/2015 Rosaria Ferries, PA-C 09/25/2016  Chief Complaint  Patient presents with  . Chest Pain    pt states some light pain but notnothing like before having the heart attack.   . Shortness of Breath    states feeling SOB when walking   . Edema    states a little in both her ankles   . Follow-up    states no dizziness or light headedness     History of Present Illness: Alexandra Gross is a 83 y.o. female with a history of  NSTEMI 2016 w/ med rx for CFX 90%, OM 95% (PCI would be high risk), HTN, HLD, B12 def, obesity, OA, moderate right ICA and mild left ICA disease by carotid Dopplers, D-CHF.  8/30 office visit, no ischemic symptoms, Toprol-XL and sublingual nitroglycerin restarted, no Plavix because of bloody stools, GI referral made, restart Lipitor 40, Lasix twice a week for dyspnea on exertion and mild volume overload  [name] presents for cardiology follow up.  She has been waking in the night with pain in her hands. This has been going on for about a week. She has not been taking anything for it.   Occasionally gets chest pain, stabbing pain. Not exertional. 1-2 x week.    Has DOE, chronic. She cannot walk fast or very far. She uses a cane to get to the mailbox.   No longer having blood in her stools since the Plavix was stopped. She had a colonoscopy and was told everything was ok. Per Dr Doyne Keel note, hemorrhoids likely the cause of the bleeding.   She took 2 pills this am, not sure which ones, she is out of the metoprolol, needs refills. However, the bottle is only for 25 mg, she should be on 50 mg.    Past Medical History:  Diagnosis Date  . Arthritis   . B12 deficiency   . CAD (coronary artery disease)    a. 12/2010 Cath: nonobs dzs;  b. 12/2014 NSTEMI/Cath: LM nl, LAD nl, LCX 90p, 49m OM1 95ost, OM2  small, RCA 50p/d-->PCI of complex LCX/OM lesion considered, felt to be high risk-->Med Rx.  . CHF ([place]estive heart failure) ([facility]   . Chronic kidney disease   . Diabetes mellitus   . [place]  . History of echocardiogram    a.  Echo 11/16: mild LVH, EF 55%, no RWMA, Gr 1 DD, MAC, mild LAE, normal RVF  . Hyperlipidemia   . Hypertension   . Obesity     Past Surgical History:  Procedure Laterality Date  . ABDOMINAL HYSTERECTOMY  1972  . APPENDECTOMY    . BACK SURGERY    . CARDIAC CATHETERIZATION N/A 12/31/2014   Procedure: Left Heart Cath and Coronary Angiography;  Surgeon: Peter M JMartinique MD;  [facility];  Service: Cardiovascular;  Laterality: N/A;  . CATARACT EXTRACTION Bilateral   . LEFT HEART CATHETERIZATION WITH CORONARY ANGIOGRAM N/A 01/06/2011   Procedure: LEFT HEART CATHETERIZATION WITH CORONARY ANGIOGRAM;  Surgeon: JMinus Breeding MD;  [facility];  Service: Cardiovascular;  Laterality: N/A;  . tumor removed     Abdomen (benign)    Current Outpatient Medications  Medication Sig Dispense Refill  . amLODipine (NORVASC) 10 MG tablet TAKE 1 TABLET BY MOUTH EVERY DAY 90 tablet  2  . aspirin 81 MG tablet Take 81 mg by mouth daily.    Marland Kitchen atorvastatin (LIPITOR) 40 MG tablet Take 1 tablet (40 mg total) by mouth daily at 6 PM. 90 tablet 0  . furosemide (LASIX) 20 MG tablet Take 1 tablet (20 mg total) by mouth 2 (two) times a week. OK TO TAKE EXTRA FOR PRN SWELLING 45 tablet 0  . isosorbide mononitrate (IMDUR) 60 MG 24 hr tablet TAKE 1.5 TABLETS (90 MG TOTAL) BY MOUTH DAILY. 45 tablet 3  . metFORMIN (GLUCOPHAGE-XR) 500 MG 24 hr tablet Take 2 tablets (1,000 mg total) by mouth 2 (two) times daily. 360 tablet 3  . metoprolol succinate (TOPROL-XL) 50 MG 24 hr tablet Take 1 tablet (50 mg total) by mouth daily. 90 tablet 3  . nitroGLYCERIN (NITROSTAT) 0.4 MG SL tablet PLACE 1 TABLET (0.4 MG TOTAL) UNDER THE TONGUE EVERY 5 (FIVE) MINUTES AS NEEDED  FOR CHEST PAIN. 25 tablet 3  . Omega-3 Fatty Acids (FISH OIL) 1000 MG CAPS Take 1 capsule by mouth daily.    Vladimir Faster Glycol-Propyl Glycol (SYSTANE ULTRA OP) Place 1 drop into both eyes 2 (two) times daily.    . vitamin B-12 (CYANOCOBALAMIN) 500 MCG tablet Take 1 tablet (500 mcg total) by mouth daily. 30 tablet 1  . VITAMIN E PO Take 1 capsule by mouth daily.       No current facility-administered medications for this visit.     Allergies:   Lisinopril and Penicillins    Social History:  The patient  reports that she quit smoking about 23 years ago. Her smoking use included cigarettes. she has never used smokeless tobacco. She reports that she does not drink alcohol or use drugs.   Family History:  The patient's family history includes Arthritis in her mother; Diabetes in her mother; Heart disease in her mother; Hypertension in her mother; Stroke in her maternal grandfather and maternal grandmother.    ROS:  Please see the history of present illness. All other systems are reviewed and negative.    PHYSICAL EXAM: VS:  BP (!) 153/77   Pulse 87   Ht 5' 3"  (1.6 m)   Wt 202 lb 6.4 oz (91.8 kg)   SpO2 95%   BMI 35.85 kg/m  , BMI Body mass index is 35.85 kg/m. GEN: Well nourished, well developed, female in no acute distress  HEENT: normal for age  Neck: no JVD, left carotid bruit, no masses Cardiac: RRR; soft murmur, no rubs, or gallops Respiratory: Decreased breath sounds bases bilaterally, normal work of breathing GI: soft, nontender, nondistended, + BS MS: no deformity or atrophy; trace edema; distal pulses are 2+ in all 4 extremities   Skin: warm and dry, no rash Neuro:  Strength and sensation are intact Psych: euthymic mood, full affect   EKG:  EKG is not ordered today.  ECHO: 01/16/2015 - Left ventricle: The cavity size was normal. Wall thickness was increased in a pattern of mild LVH. The estimated ejection fraction was 55%. Wall motion was normal; there were no  regional wall motion abnormalities. Doppler parameters are consistent with abnormal left ventricular relaxation (grade 1 diastolicdysfunction). - Aortic valve: There was no stenosis. - Mitral valve: Mildly calcified annulus. Mildly calcified leaflets . There was no significant regurgitation. - Left atrium: The atrium was mildly dilated. - Right ventricle: The cavity size was normal. Systolic function was normal. - Pulmonary arteries: No complete TR doppler jet so unable to estimate PA systolic pressure. -  Inferior vena cava: The vessel was normal in size. The respirophasic diameter changes were in the normal range (>= 50%), consistent with normal central venous pressure. Impressions: - Normal LV size with mild LV hypertrophy. EF 55%. Normal RV size and systolic function. No significant valvular abnormalities.  CARDIAC CATH: 12/31/2014 Diagnostic Diagram      Recent Labs: 06/23/2016: Hemoglobin 11.3; Platelets 260.0; TSH 2.53 12/21/2016: ALT 32; BUN 28; Creatinine, Ser 1.47; Potassium 4.3; Sodium 138    Lipid Panel    Component Value Date/Time   CHOL 129 12/21/2016 1123   TRIG 117.0 12/21/2016 1123   HDL 44.20 12/21/2016 1123   CHOLHDL 3 12/21/2016 1123   VLDL 23.4 12/21/2016 1123   LDLCALC 61 12/21/2016 1123   LDLDIRECT 66.0 12/19/2014 1620     Wt Readings from Last 3 Encounters:  02/08/17 202 lb 6.4 oz (91.8 kg)  01/08/17 203 lb (92.1 kg)  12/25/16 200 lb (90.7 kg)     Other studies Reviewed: Additional studies/ records that were reviewed today include: Office notes, hospital records and testing.  ASSESSMENT AND PLAN:  1.  Chronic diastolic CHF: She is on a minimal dose of Lasix, 20 mg 2 times a week.  I have asked her to take it 3 times a week, Monday Wednesday and Friday.  She never takes a as needed dose.  She does not feel her dyspnea on exertion has changed recently.  She has a history of anemia and chronic kidney disease, check a CBC and  BMET today  2.  Hypertension: The increase in Lasix may help her blood pressure a little bit.  She is on amlodipine 10 mg in the Lasix as well as Imdur 90 mg a day, but is not on a beta-blocker.  Her bottle of Toprol-XL 25 mg is empty.  We had increased the dose to 50 mg but apparently she never got this filled.  We will start Toprol-XL 50 mg daily.  3.  CAD: I am also concerned that the dyspnea on exertion could be an anginal equivalent.  She is on max dose of amlodipine, baby aspirin and Imdur at 90 mg a day.  We are adding a beta-blocker.  She has not had exertional chest pain.   Current medicines are reviewed at length with the patient today.  The patient does not have concerns regarding medicines.  The following changes have been made: Add Toprol-XL 50 mg daily  Labs/ tests ordered today include:   Orders Placed This Encounter  Procedures  . Basic metabolic panel  . CBC     Disposition:   FU with Dr. Percival Spanish  Signed, Rosaria Ferries, PA-C  02/08/2017 4:29 PM    Reading Phone: [phone]; Fax: [phone]  This note was written with the assistance of speech recognition software. Please excuse any transcriptional errors.

## 2017-02-08 NOTE — Patient Instructions (Signed)
Medication Instructions:  START TOPROL XL 50MG   If you need a refill on your cardiac medications before your next appointment, please call your pharmacy.  Labwork: BMET AND CBC TODAY HERE IN OUR OFFICE AT [facility]  Follow-Up: Your physician wants you to follow-up in: [place].  Thank you for choosing [facility] at [facility]!!

## 2017-02-17 ENCOUNTER — Other Ambulatory Visit: Payer: Self-pay

## 2017-02-17 MED ORDER — FUROSEMIDE 20 MG PO TABS: 20.0000 mg | ORAL_TABLET | ORAL | 9 refills | Status: AC

## 2017-03-03 ENCOUNTER — Other Ambulatory Visit: Payer: Self-pay

## 2017-03-03 MED ORDER — METFORMIN HCL ER 500 MG PO TB24: 1000.0000 mg | ORAL_TABLET | Freq: Two times a day (BID) | ORAL | 1 refills | Status: AC

## 2017-04-22 ENCOUNTER — Other Ambulatory Visit: Payer: Self-pay | Admitting: Cardiology

## 2017-04-22 NOTE — Telephone Encounter (Signed)
Rx(s) sent to pharmacy electronically.  

## 2017-05-03 ENCOUNTER — Encounter: Payer: Self-pay | Admitting: Cardiology

## 2017-05-03 ENCOUNTER — Telehealth: Payer: Self-pay | Admitting: Cardiology

## 2017-05-03 NOTE — Telephone Encounter (Signed)
Closed Encounter  °

## 2017-05-10 ENCOUNTER — Ambulatory Visit: Payer: Medicare Other | Admitting: Cardiology

## 2017-05-18 ENCOUNTER — Other Ambulatory Visit (INDEPENDENT_AMBULATORY_CARE_PROVIDER_SITE_OTHER): Payer: Medicare Other

## 2017-05-18 DIAGNOSIS — E1121 Type 2 diabetes mellitus with diabetic nephropathy: Secondary | ICD-10-CM | POA: Diagnosis not present

## 2017-05-18 LAB — HEPATIC FUNCTION PANEL
ALT: 19 U/L (ref 0–35)
AST: 22 U/L (ref 0–37)
Albumin: 3.8 g/dL (ref 3.5–5.2)
Alkaline Phosphatase: 72 U/L (ref 39–117)
Bilirubin, Direct: 0.1 mg/dL (ref 0.0–0.3)
Total Bilirubin: 0.5 mg/dL (ref 0.2–1.2)
Total Protein: 7.7 g/dL (ref 6.0–8.3)

## 2017-05-18 LAB — HEMOGLOBIN A1C: HEMOGLOBIN A1C: 7.4 % — AB (ref 4.6–6.5)

## 2017-05-18 LAB — CBC WITH DIFFERENTIAL/PLATELET
Basophils Absolute: 0.1 K/uL (ref 0.0–0.1)
Basophils Relative: 0.7 % (ref 0.0–3.0)
Eosinophils Absolute: 0.1 K/uL (ref 0.0–0.7)
Eosinophils Relative: 1.3 % (ref 0.0–5.0)
HCT: 35.9 % — ABNORMAL LOW (ref 36.0–46.0)
Hemoglobin: 11.9 g/dL — ABNORMAL LOW (ref 12.0–15.0)
Lymphocytes Relative: 47.3 % — ABNORMAL HIGH (ref 12.0–46.0)
Lymphs Abs: 4.3 K/uL — ABNORMAL HIGH (ref 0.7–4.0)
MCHC: 33.1 g/dL (ref 30.0–36.0)
MCV: 86.6 fl (ref 78.0–100.0)
Monocytes Absolute: 0.7 K/uL (ref 0.1–1.0)
Monocytes Relative: 8.1 % (ref 3.0–12.0)
Neutro Abs: 3.8 K/uL (ref 1.4–7.7)
Neutrophils Relative %: 42.6 % — ABNORMAL LOW (ref 43.0–77.0)
Platelets: 152 K/uL (ref 150.0–400.0)
RBC: 4.15 Mil/uL (ref 3.87–5.11)
RDW: 14.2 % (ref 11.5–15.5)
WBC: 9 K/uL (ref 4.0–10.5)

## 2017-05-18 LAB — BASIC METABOLIC PANEL
BUN: 23 mg/dL (ref 6–23)
CALCIUM: 9.2 mg/dL (ref 8.4–10.5)
CHLORIDE: 108 meq/L (ref 96–112)
CO2: 26 meq/L (ref 19–32)
CREATININE: 1.38 mg/dL — AB (ref 0.40–1.20)
GFR: 46.86 mL/min — ABNORMAL LOW (ref >60.00)
Glucose, Bld: 252 mg/dL — ABNORMAL HIGH (ref 70–99)
Potassium: 4.4 mEq/L (ref 3.5–5.1)
Sodium: 140 mEq/L (ref 135–145)

## 2017-05-18 LAB — MICROALBUMIN / CREATININE URINE RATIO
CREATININE, U: 117.9 mg/dL
MICROALB UR: 55.2 mg/dL — AB (ref 0.0–1.9)
MICROALB/CREAT RATIO: 46.8 mg/g — AB (ref 0.0–30.0)

## 2017-05-18 LAB — URINALYSIS, ROUTINE W REFLEX MICROSCOPIC
Bilirubin Urine: NEGATIVE
Ketones, ur: NEGATIVE
Leukocytes, UA: NEGATIVE
NITRITE: NEGATIVE
Specific Gravity, Urine: >=1.03 — AB (ref 1.000–1.030)
Total Protein, Urine: 100 — AB
URINE GLUCOSE: NEGATIVE
Urobilinogen, UA: 0.2 (ref 0.0–1.0)
pH: 5.5 (ref 5.0–8.0)

## 2017-05-18 LAB — LIPID PANEL
Cholesterol: 192 mg/dL (ref 0–200)
HDL: 39.9 mg/dL
LDL Cholesterol: 128 mg/dL — ABNORMAL HIGH (ref 0–99)
NonHDL: 152.44
Total CHOL/HDL Ratio: 5
Triglycerides: 124 mg/dL (ref 0.0–149.0)
VLDL: 24.8 mg/dL (ref 0.0–40.0)

## 2017-05-18 LAB — TSH: TSH: 1.93 u[IU]/mL (ref 0.35–4.50)

## 2017-05-19 NOTE — Progress Notes (Signed)
Cardiology Office Note   Date:  05/20/2017   ID:  Alexandra Gross, DOB Feb 11, 1932, MRN [id number]  PCP:  Biagio Borg, MD  Cardiologist:   Minus Breeding, MD   No chief complaint on file.     History of Present Illness: Alexandra Gross is a 84 y.o. female who presents for follow-up of NSTEMI.   The patient does have single-vessel CAD.   However, because this was high risk she was managed medically.  She had hemorrhoidal bleeding on Plavix so she stopped this.  Since I last saw her she has stopped all of her medicines and she "feels like a human being again."  She said she was having muscle aches and pains and she just did not want to take these medicines anymore.  She is not having any new chest discomfort, neck or arm discomfort.  She is not having any new shortness of breath, PND or orthopnea.  She is had no palpitations, presyncope or syncope.  She has some chronic dyspnea on exertion and she appeared to be short of breath when she came into the office her saturation was in the mid 90s.   Past Medical History:  Diagnosis Date  . Arthritis   . B12 deficiency   . CAD (coronary artery disease)    a. 12/2010 Cath: nonobs dzs;  b. 12/2014 NSTEMI/Cath: LM nl, LAD nl, LCX 90p, 30m OM1 95ost, OM2 small, RCA 50p/d-->PCI of complex LCX/OM lesion considered, felt to be high risk-->Med Rx.  . [place]) [place]   . Chronic kidney disease   . Diabetes mellitus   . [place]  . History of echocardiogram    a.  Echo 11/16: mild LVH, EF 55%, no RWMA, Gr 1 DD, MAC, mild LAE, normal RVF  . Hyperlipidemia   . Hypertension   . Obesity     Past Surgical History:  Procedure Laterality Date  . ABDOMINAL HYSTERECTOMY  1972  . APPENDECTOMY    . BACK SURGERY    . CARDIAC CATHETERIZATION N/A 12/31/2014   Procedure: Left Heart Cath and Coronary Angiography;  Surgeon: Peter M JMartinique MD;  [facility];  Service: Cardiovascular;  Laterality: N/A;  .  CATARACT EXTRACTION Bilateral   . LEFT HEART CATHETERIZATION WITH CORONARY ANGIOGRAM N/A 01/06/2011   Procedure: LEFT HEART CATHETERIZATION WITH CORONARY ANGIOGRAM;  Surgeon: JMinus Breeding MD;  [facility];  Service: Cardiovascular;  Laterality: N/A;  . tumor removed     Abdomen (benign)     Current Outpatient Medications  Medication Sig Dispense Refill  . amLODipine (NORVASC) 10 MG tablet Take 1 tablet (10 mg total) by mouth daily. 90 tablet 2  . aspirin 81 MG tablet Take 81 mg by mouth daily.    .Marland Kitchenatorvastatin (LIPITOR) 40 MG tablet TAKE 1 TABLET BY MOUTH DAILY AT 6 PM. (Patient not taking: Reported on 05/20/2017) 90 tablet 0  . furosemide (LASIX) 20 MG tablet Take 1 tablet (20 mg total) by mouth 2 (two) times a week. OK TO TAKE EXTRA FOR PRN SWELLING (Patient not taking: Reported on 05/20/2017) 45 tablet 9  . isosorbide mononitrate (IMDUR) 60 MG 24 hr tablet TAKE 1.5 TABLETS (90 MG TOTAL) BY MOUTH DAILY. (Patient not taking: Reported on 05/20/2017) 45 tablet 3  . metFORMIN (GLUCOPHAGE-XR) 500 MG 24 hr tablet Take 2 tablets (1,000 mg total) by mouth 2 (two) times daily. (Patient not taking: Reported on 05/20/2017) 360 tablet 1  .  metoprolol succinate (TOPROL-XL) 50 MG 24 hr tablet Take 1 tablet (50 mg total) by mouth daily. (Patient not taking: Reported on 05/20/2017) 90 tablet 3  . nitroGLYCERIN (NITROSTAT) 0.4 MG SL tablet PLACE 1 TABLET (0.4 MG TOTAL) UNDER THE TONGUE EVERY 5 (FIVE) MINUTES AS NEEDED FOR CHEST PAIN. (Patient not taking: Reported on 05/20/2017) 25 tablet 3  . Omega-3 Fatty Acids (FISH OIL) 1000 MG CAPS Take 1 capsule by mouth daily.    Vladimir Faster Glycol-Propyl Glycol (SYSTANE ULTRA OP) Place 1 drop into both eyes 2 (two) times daily.    . vitamin B-12 (CYANOCOBALAMIN) 500 MCG tablet Take 1 tablet (500 mcg total) by mouth daily. (Patient not taking: Reported on 05/20/2017) 30 tablet 1  . VITAMIN E PO Take 1 capsule by mouth daily.       No current  facility-administered medications for this visit.     Allergies:   Lisinopril and Penicillins    ROS:  Please see the history of present illness.   Otherwise, review of systems are positive for none.   All other systems are reviewed and negative.    PHYSICAL EXAM: VS:  BP (!) 186/70   Pulse 79   Ht _0  (1.6 m)   Wt 201 lb (91.2 kg)   BMI 35.61 kg/m  , BMI Body mass index is 35.61 kg/m.  GENERAL:  Well appearing NECK:  No jugular venous distention, waveform within normal limits, carotid upstroke brisk and symmetric, no bruits, no thyromegaly LUNGS:  Clear to auscultation bilaterally CHEST:  Unremarkable HEART:  PMI not displaced or sustained,S1 and S2 within normal limits, no S3, no S4, no clicks, no rubs, none murmurs ABD:  Flat, positive bowel sounds normal in frequency in pitch, no bruits, no rebound, no guarding, no midline pulsatile mass, no hepatomegaly, no splenomegaly EXT:  2 plus pulses throughout, no edema, no cyanosis no clubbing    EKG:  EKG not ordered today.   Recent Labs: 05/18/2017: ALT 19; BUN 23; Creatinine, Ser 1.38; Hemoglobin 11.9; Platelets 152.0; Potassium 4.4; Sodium 140; TSH 1.93    Lipid Panel    Component Value Date/Time   CHOL 192 05/18/2017 0937   TRIG 124.0 05/18/2017 0937   HDL 39.90 05/18/2017 0937   CHOLHDL 5 05/18/2017 0937   VLDL 24.8 05/18/2017 0937   LDLCALC 128 (H) 05/18/2017 0937   LDLDIRECT 66.0 12/19/2014 1620   Lab Results  Component Value Date   HGBA1C 7.4 (H) 05/18/2017    Wt Readings from Last 3 Encounters:  05/20/17 201 lb (91.2 kg)  02/08/17 202 lb 6.4 oz (91.8 kg)  01/08/17 203 lb (92.1 kg)     Other studies Reviewed: Additional studies/ records that were reviewed today include: I reviewed labs drawn two days ago.  Review of the above records demonstrates:       ASSESSMENT AND PLAN:   CAD: The patient has no new sypmtoms.  No further cardiovascular testing is indicated.  We will continue with aggressive  risk reduction and meds as listed.  She is not taking any of her medicines.  I tried to talk her into taking an aspirin and she probably will not do this.  She certainly will not take any other medicines except as below.  HTN:   She does agree to take the Norvasc.   Hyperlipidemia:  LDL was 128.  It had been 61.  This reflects that she is not taking her medication list but she cannot be convinced to restart them.  CKD:  Creat was slightly better than before at 1.38.   CAROTID:    Moderate R carotid stenosis and mild L carotid stenosis. this will be repeated in July.    Diabetes: A1C was 7.4.  She is not taking any of her meds. Biagio Borg, MD  FU with PCP.    Current medicines are reviewed at length with the patient today.  The patient does not have concerns regarding medicines.  The following changes have been made:  I did try to restart some of the medicines.   Labs/ tests ordered today include:   None    Disposition:   FU with me in one year.    Signed, Minus Breeding, MD  05/20/2017 10:09 AM    [facility]

## 2017-05-20 ENCOUNTER — Ambulatory Visit: Payer: Medicare Other | Admitting: Cardiology

## 2017-05-20 ENCOUNTER — Encounter: Payer: Self-pay | Admitting: Cardiology

## 2017-05-20 VITALS — BP 186/70 | HR 79 | Ht 63.0 in | Wt 201.0 lb

## 2017-05-20 DIAGNOSIS — E118 Type 2 diabetes mellitus with unspecified complications: Secondary | ICD-10-CM

## 2017-05-20 DIAGNOSIS — N183 Chronic kidney disease, stage 3 unspecified: Secondary | ICD-10-CM

## 2017-05-20 DIAGNOSIS — I6523 Occlusion and stenosis of bilateral carotid arteries: Secondary | ICD-10-CM

## 2017-05-20 DIAGNOSIS — E785 Hyperlipidemia, unspecified: Secondary | ICD-10-CM

## 2017-05-20 DIAGNOSIS — I2510 Atherosclerotic heart disease of native coronary artery without angina pectoris: Secondary | ICD-10-CM

## 2017-05-20 MED ORDER — AMLODIPINE BESYLATE 10 MG PO TABS: 10.0000 mg | ORAL_TABLET | Freq: Every day | ORAL | 2 refills | Status: AC

## 2017-05-20 NOTE — Patient Instructions (Addendum)
Medication Instructions:  Continue current medication  If you need a refill on your cardiac medications before your next appointment, please call your pharmacy.  Labwork: None Ordered   Testing/Procedures: Your physician has requested that you have a carotid duplex in August. This test is an ultrasound of the carotid arteries in your neck. It looks at blood flow through these arteries that supply the brain with blood. Allow one hour for this exam. There are no restrictions or special instructions.  Follow-Up: Your physician wants you to follow-up in: 1 Year. You should receive a reminder letter in the mail two months in advance. If you do not receive a letter, please call our office [phone].     Thank you for choosing [facility] at [facility]!!

## 2017-05-24 ENCOUNTER — Encounter (HOSPITAL_COMMUNITY): Payer: Self-pay | Admitting: Emergency Medicine

## 2017-05-24 ENCOUNTER — Inpatient Hospital Stay (HOSPITAL_COMMUNITY): Payer: Medicare Other

## 2017-05-24 ENCOUNTER — Emergency Department (HOSPITAL_COMMUNITY): Payer: Medicare Other

## 2017-05-24 DIAGNOSIS — I130 Hypertensive heart and chronic kidney disease with heart failure and stage 1 through stage 4 chronic kidney disease, or unspecified chronic kidney disease: Secondary | ICD-10-CM | POA: Diagnosis not present

## 2017-05-24 DIAGNOSIS — E785 Hyperlipidemia, unspecified: Secondary | ICD-10-CM | POA: Diagnosis present

## 2017-05-24 DIAGNOSIS — I5032 Chronic diastolic (congestive) heart failure: Secondary | ICD-10-CM | POA: Diagnosis present

## 2017-05-24 DIAGNOSIS — J811 Chronic pulmonary edema: Secondary | ICD-10-CM | POA: Diagnosis not present

## 2017-05-24 DIAGNOSIS — J9621 Acute and chronic respiratory failure with hypoxia: Secondary | ICD-10-CM | POA: Diagnosis not present

## 2017-05-24 DIAGNOSIS — R402112 Coma scale, eyes open, never, at arrival to emergency department: Secondary | ICD-10-CM | POA: Diagnosis present

## 2017-05-24 DIAGNOSIS — E1165 Type 2 diabetes mellitus with hyperglycemia: Secondary | ICD-10-CM | POA: Diagnosis present

## 2017-05-24 DIAGNOSIS — Z6835 Body mass index (BMI) 35.0-35.9, adult: Secondary | ICD-10-CM

## 2017-05-24 DIAGNOSIS — Z8249 Family history of ischemic heart disease and other diseases of the circulatory system: Secondary | ICD-10-CM

## 2017-05-24 DIAGNOSIS — Z9114 Patient's other noncompliance with medication regimen: Secondary | ICD-10-CM

## 2017-05-24 DIAGNOSIS — E872 Acidosis, unspecified: Secondary | ICD-10-CM

## 2017-05-24 DIAGNOSIS — R739 Hyperglycemia, unspecified: Secondary | ICD-10-CM | POA: Diagnosis not present

## 2017-05-24 DIAGNOSIS — Z515 Encounter for palliative care: Secondary | ICD-10-CM | POA: Diagnosis not present

## 2017-05-24 DIAGNOSIS — R4020 Unspecified coma: Secondary | ICD-10-CM | POA: Diagnosis not present

## 2017-05-24 DIAGNOSIS — E669 Obesity, unspecified: Secondary | ICD-10-CM | POA: Diagnosis present

## 2017-05-24 DIAGNOSIS — E874 Mixed disorder of acid-base balance: Secondary | ICD-10-CM | POA: Diagnosis present

## 2017-05-24 DIAGNOSIS — Z87891 Personal history of nicotine dependence: Secondary | ICD-10-CM

## 2017-05-24 DIAGNOSIS — I252 Old myocardial infarction: Secondary | ICD-10-CM

## 2017-05-24 DIAGNOSIS — E538 Deficiency of other specified B group vitamins: Secondary | ICD-10-CM | POA: Diagnosis present

## 2017-05-24 DIAGNOSIS — R570 Cardiogenic shock: Secondary | ICD-10-CM | POA: Diagnosis not present

## 2017-05-24 DIAGNOSIS — Z66 Do not resuscitate: Secondary | ICD-10-CM | POA: Diagnosis not present

## 2017-05-24 DIAGNOSIS — R402212 Coma scale, best verbal response, none, at arrival to emergency department: Secondary | ICD-10-CM | POA: Diagnosis present

## 2017-05-24 DIAGNOSIS — Z823 Family history of stroke: Secondary | ICD-10-CM

## 2017-05-24 DIAGNOSIS — G931 Anoxic brain damage, not elsewhere classified: Secondary | ICD-10-CM | POA: Diagnosis not present

## 2017-05-24 DIAGNOSIS — I2510 Atherosclerotic heart disease of native coronary artery without angina pectoris: Secondary | ICD-10-CM | POA: Diagnosis not present

## 2017-05-24 DIAGNOSIS — R402312 Coma scale, best motor response, none, at arrival to emergency department: Secondary | ICD-10-CM | POA: Diagnosis present

## 2017-05-24 DIAGNOSIS — Z833 Family history of diabetes mellitus: Secondary | ICD-10-CM

## 2017-05-24 DIAGNOSIS — R680 Hypothermia, not associated with low environmental temperature: Secondary | ICD-10-CM | POA: Diagnosis not present

## 2017-05-24 DIAGNOSIS — E1122 Type 2 diabetes mellitus with diabetic chronic kidney disease: Secondary | ICD-10-CM | POA: Diagnosis not present

## 2017-05-24 DIAGNOSIS — J90 Pleural effusion, not elsewhere classified: Secondary | ICD-10-CM | POA: Diagnosis not present

## 2017-05-24 DIAGNOSIS — I2109 ST elevation (STEMI) myocardial infarction involving other coronary artery of anterior wall: Secondary | ICD-10-CM | POA: Diagnosis not present

## 2017-05-24 DIAGNOSIS — R0989 Other specified symptoms and signs involving the circulatory and respiratory systems: Secondary | ICD-10-CM | POA: Diagnosis not present

## 2017-05-24 DIAGNOSIS — R34 Anuria and oliguria: Secondary | ICD-10-CM | POA: Diagnosis present

## 2017-05-24 DIAGNOSIS — Z7984 Long term (current) use of oral hypoglycemic drugs: Secondary | ICD-10-CM

## 2017-05-24 DIAGNOSIS — I469 Cardiac arrest, cause unspecified: Secondary | ICD-10-CM | POA: Diagnosis not present

## 2017-05-24 DIAGNOSIS — D72829 Elevated white blood cell count, unspecified: Secondary | ICD-10-CM | POA: Diagnosis not present

## 2017-05-24 DIAGNOSIS — R9431 Abnormal electrocardiogram [ECG] [EKG]: Secondary | ICD-10-CM | POA: Insufficient documentation

## 2017-05-24 DIAGNOSIS — Z8261 Family history of arthritis: Secondary | ICD-10-CM

## 2017-05-24 DIAGNOSIS — Z4682 Encounter for fitting and adjustment of non-vascular catheter: Secondary | ICD-10-CM | POA: Diagnosis not present

## 2017-05-24 DIAGNOSIS — Z7982 Long term (current) use of aspirin: Secondary | ICD-10-CM

## 2017-05-24 DIAGNOSIS — N189 Chronic kidney disease, unspecified: Secondary | ICD-10-CM | POA: Diagnosis not present

## 2017-05-24 DIAGNOSIS — N179 Acute kidney failure, unspecified: Secondary | ICD-10-CM | POA: Diagnosis not present

## 2017-05-24 LAB — RESPIRATORY PANEL BY PCR
Adenovirus: NOT DETECTED
BORDETELLA PERTUSSIS-RVPCR: NOT DETECTED
CHLAMYDOPHILA PNEUMONIAE-RVPPCR: NOT DETECTED
CORONAVIRUS OC43-RVPPCR: NOT DETECTED
Coronavirus 229E: NOT DETECTED
Coronavirus HKU1: NOT DETECTED
Coronavirus NL63: NOT DETECTED
INFLUENZA A-RVPPCR: NOT DETECTED
INFLUENZA B-RVPPCR: NOT DETECTED
MYCOPLASMA PNEUMONIAE-RVPPCR: NOT DETECTED
Metapneumovirus: NOT DETECTED
PARAINFLUENZA VIRUS 4-RVPPCR: NOT DETECTED
Parainfluenza Virus 1: NOT DETECTED
Parainfluenza Virus 2: NOT DETECTED
Parainfluenza Virus 3: NOT DETECTED
RESPIRATORY SYNCYTIAL VIRUS-RVPPCR: NOT DETECTED
Rhinovirus / Enterovirus: NOT DETECTED

## 2017-05-24 LAB — PROTIME-INR
INR: 1.64
Prothrombin Time: 19.3 seconds — ABNORMAL HIGH (ref 11.4–15.2)

## 2017-05-24 LAB — GLUCOSE, CAPILLARY
GLUCOSE-CAPILLARY: 355 mg/dL — AB (ref 65–99)
GLUCOSE-CAPILLARY: 288 mg/dL — AB (ref 65–99)
GLUCOSE-CAPILLARY: 194 mg/dL — AB (ref 65–99)
GLUCOSE-CAPILLARY: 243 mg/dL — AB (ref 65–99)
GLUCOSE-CAPILLARY: 254 mg/dL — AB (ref 65–99)
GLUCOSE-CAPILLARY: 269 mg/dL — AB (ref 65–99)
GLUCOSE-CAPILLARY: 238 mg/dL — AB (ref 65–99)
GLUCOSE-CAPILLARY: 187 mg/dL — AB (ref 65–99)
GLUCOSE-CAPILLARY: 154 mg/dL — AB (ref 65–99)
Glucose-Capillary: 290 mg/dL — ABNORMAL HIGH (ref 65–99)
Glucose-Capillary: 285 mg/dL — ABNORMAL HIGH (ref 65–99)
Glucose-Capillary: 289 mg/dL — ABNORMAL HIGH (ref 65–99)
Glucose-Capillary: 260 mg/dL — ABNORMAL HIGH (ref 65–99)

## 2017-05-24 LAB — CBC
HEMATOCRIT: 32.2 % — AB (ref 36.0–46.0)
Hemoglobin: 9.4 g/dL — ABNORMAL LOW (ref 12.0–15.0)
MCH: 28.8 pg (ref 26.0–34.0)
MCHC: 29.2 g/dL — ABNORMAL LOW (ref 30.0–36.0)
MCV: 98.8 fL (ref 78.0–100.0)
PLATELETS: 167 10*3/uL (ref 150–400)
RBC: 3.26 MIL/uL — ABNORMAL LOW (ref 3.87–5.11)
RDW: 14.1 % (ref 11.5–15.5)
WBC: 16.4 10*3/uL — AB (ref 4.0–10.5)

## 2017-05-24 LAB — COMPREHENSIVE METABOLIC PANEL
ALBUMIN: 2.4 g/dL — AB (ref 3.5–5.0)
ALT: 33 U/L (ref 14–54)
AST: 50 U/L — AB (ref 15–41)
Alkaline Phosphatase: 60 U/L (ref 38–126)
Anion gap: 20 — ABNORMAL HIGH (ref 5–15)
BILIRUBIN TOTAL: 0.4 mg/dL (ref 0.3–1.2)
BUN: 17 mg/dL (ref 6–20)
CHLORIDE: 111 mmol/L (ref 101–111)
CO2: 11 mmol/L — ABNORMAL LOW (ref 22–32)
CREATININE: 1.96 mg/dL — AB (ref 0.44–1.00)
Calcium: 8.3 mg/dL — ABNORMAL LOW (ref 8.9–10.3)
GFR calc Af Amer: 26 mL/min — ABNORMAL LOW (ref >60)
GFR calc non Af Amer: 22 mL/min — ABNORMAL LOW (ref >60)
GLUCOSE: 429 mg/dL — AB (ref 65–99)
POTASSIUM: 4.3 mmol/L (ref 3.5–5.1)
Sodium: 142 mmol/L (ref 135–145)
Total Protein: 5.2 g/dL — ABNORMAL LOW (ref 6.5–8.1)

## 2017-05-24 LAB — GASTROINTESTINAL PANEL BY PCR, STOOL (REPLACES STOOL CULTURE)
Adenovirus F40/41: NOT DETECTED
Astrovirus: NOT DETECTED
Campylobacter species: NOT DETECTED
Cryptosporidium: NOT DETECTED
Cyclospora cayetanensis: NOT DETECTED
Entamoeba histolytica: NOT DETECTED
Enteroaggregative E coli (EAEC): NOT DETECTED
Enteropathogenic E coli (EPEC): NOT DETECTED
Enterotoxigenic E coli (ETEC): NOT DETECTED
Giardia lamblia: NOT DETECTED
NOROVIRUS GI/GII: NOT DETECTED
Plesimonas shigelloides: NOT DETECTED
ROTAVIRUS A: NOT DETECTED
SAPOVIRUS (I, II, IV, AND V): NOT DETECTED
SHIGA LIKE TOXIN PRODUCING E COLI (STEC): NOT DETECTED
SHIGELLA/ENTEROINVASIVE E COLI (EIEC): NOT DETECTED
Salmonella species: NOT DETECTED
Vibrio cholerae: NOT DETECTED
Vibrio species: NOT DETECTED
Yersinia enterocolitica: NOT DETECTED

## 2017-05-24 LAB — I-STAT CHEM 8, ED
BUN: 19 mg/dL (ref 6–20)
CALCIUM ION: 1.07 mmol/L — AB (ref 1.15–1.40)
CREATININE: 1.7 mg/dL — AB (ref 0.44–1.00)
Chloride: 113 mmol/L — ABNORMAL HIGH (ref 101–111)
GLUCOSE: 415 mg/dL — AB (ref 65–99)
HEMATOCRIT: 29 % — AB (ref 36.0–46.0)
HEMOGLOBIN: 9.9 g/dL — AB (ref 12.0–15.0)
Potassium: 4.2 mmol/L (ref 3.5–5.1)
Sodium: 142 mmol/L (ref 135–145)
TCO2: 13 mmol/L — AB (ref 22–32)

## 2017-05-24 LAB — CBG MONITORING, ED
Glucose-Capillary: 383 mg/dL — ABNORMAL HIGH (ref 65–99)
Glucose-Capillary: 380 mg/dL — ABNORMAL HIGH (ref 65–99)

## 2017-05-24 LAB — POCT I-STAT 3, ART BLOOD GAS (G3+)
Acid-base deficit: 10 mmol/L — ABNORMAL HIGH (ref 0.0–2.0)
BICARBONATE: 13.5 mmol/L — AB (ref 20.0–28.0)
O2 SAT: 99 %
PCO2 ART: 20.3 mmHg — AB (ref 32.0–48.0)
PO2 ART: 98 mmHg (ref 83.0–108.0)
Patient temperature: 33.4
TCO2: 14 mmol/L — AB (ref 22–32)
pH, Arterial: 7.415 (ref 7.350–7.450)

## 2017-05-24 LAB — INFLUENZA PANEL BY PCR (TYPE A & B)
INFLBPCR: NEGATIVE
Influenza A By PCR: NEGATIVE

## 2017-05-24 LAB — I-STAT TROPONIN, ED: Troponin i, poc: 0.18 ng/mL (ref 0.00–0.08)

## 2017-05-24 LAB — TROPONIN I: Troponin I: 8.07 ng/mL (ref <0.03)

## 2017-05-24 LAB — I-STAT ARTERIAL BLOOD GAS, ED
Acid-base deficit: 21 mmol/L — ABNORMAL HIGH (ref 0.0–2.0)
Bicarbonate: 10.2 mmol/L — ABNORMAL LOW (ref 20.0–28.0)
O2 SAT: 100 %
PCO2 ART: 39.7 mmHg (ref 32.0–48.0)
PH ART: 6.995 — AB (ref 7.350–7.450)
PO2 ART: 286 mmHg — AB (ref 83.0–108.0)
Patient temperature: 33.9
TCO2: 12 mmol/L — ABNORMAL LOW (ref 22–32)

## 2017-05-24 LAB — PROCALCITONIN: PROCALCITONIN: 1.89 ng/mL

## 2017-05-24 LAB — C DIFFICILE QUICK SCREEN W PCR REFLEX
C DIFFICILE (CDIFF) TOXIN: NEGATIVE
C Diff antigen: NEGATIVE
C Diff interpretation: NOT DETECTED

## 2017-05-24 LAB — BRAIN NATRIURETIC PEPTIDE
B NATRIURETIC PEPTIDE 5: 379.7 pg/mL — AB (ref 0.0–100.0)
B Natriuretic Peptide: 487.3 pg/mL — ABNORMAL HIGH (ref 0.0–100.0)

## 2017-05-24 LAB — HEPARIN LEVEL (UNFRACTIONATED): HEPARIN UNFRACTIONATED: 0.37 [IU]/mL (ref 0.30–0.70)

## 2017-05-24 LAB — HEMOGLOBIN AND HEMATOCRIT, BLOOD
HEMATOCRIT: 38.5 % (ref 36.0–46.0)
HEMOGLOBIN: 11.9 g/dL — AB (ref 12.0–15.0)

## 2017-05-24 LAB — MRSA PCR SCREENING: MRSA BY PCR: NEGATIVE

## 2017-05-24 LAB — CORTISOL: CORTISOL PLASMA: 10.4 ug/dL

## 2017-05-24 LAB — I-STAT CG4 LACTIC ACID, ED: Lactic Acid, Venous: 16.23 mmol/L (ref 0.5–1.9)

## 2017-05-24 LAB — LACTIC ACID, PLASMA: Lactic Acid, Venous: 10.2 mmol/L (ref 0.5–1.9)

## 2017-05-24 MED ORDER — NOREPINEPHRINE BITARTRATE 1 MG/ML IV SOLN
0.0000 ug/min | INTRAVENOUS | Status: DC
  Administered 2017-05-24: 22 ug/min via INTRAVENOUS
  Administered 2017-05-24: 40 ug/min via INTRAVENOUS
  Filled 2017-05-24 (×4): qty 4

## 2017-05-24 MED ORDER — CHLORHEXIDINE GLUCONATE 0.12% ORAL RINSE (MEDLINE KIT)
15.0000 mL | Freq: Two times a day (BID) | OROMUCOSAL | Status: DC
  Administered 2017-05-24 – 2017-05-25 (×2): 15 mL via OROMUCOSAL

## 2017-05-24 MED ORDER — SODIUM BICARBONATE 8.4 % IV SOLN: INTRAVENOUS | Status: DC

## 2017-05-24 MED ORDER — SODIUM CHLORIDE 0.9 % IV SOLN
INTRAVENOUS | Status: DC
  Administered 2017-05-24: 3.2 [IU]/h via INTRAVENOUS
  Filled 2017-05-24: qty 1

## 2017-05-24 MED ORDER — PANTOPRAZOLE SODIUM 40 MG PO TBEC
40.0000 mg | DELAYED_RELEASE_TABLET | Freq: Every day | ORAL | Status: DC
  Filled 2017-05-24: qty 1

## 2017-05-24 MED ORDER — SODIUM BICARBONATE 8.4 % IV SOLN
INTRAVENOUS | Status: AC | PRN
  Administered 2017-05-24: 100 meq via INTRAVENOUS

## 2017-05-24 MED ORDER — HEPARIN (PORCINE) IN NACL 100-0.45 UNIT/ML-% IJ SOLN
850.0000 [IU]/h | INTRAMUSCULAR | Status: DC
  Administered 2017-05-24: 850 [IU]/h via INTRAVENOUS
  Filled 2017-05-24 (×2): qty 250

## 2017-05-24 MED ORDER — NOREPINEPHRINE BITARTRATE 1 MG/ML IV SOLN
0.0000 ug/min | INTRAVENOUS | Status: DC
  Administered 2017-05-24: 20 ug/min via INTRAVENOUS
  Administered 2017-05-25: 9 ug/min via INTRAVENOUS
  Filled 2017-05-24 (×3): qty 16

## 2017-05-24 MED ORDER — EPINEPHRINE PF 1 MG/10ML IJ SOSY
PREFILLED_SYRINGE | INTRAMUSCULAR | Status: AC | PRN
  Administered 2017-05-24: 1 via INTRAVENOUS

## 2017-05-24 MED ORDER — FUROSEMIDE 10 MG/ML IJ SOLN
40.0000 mg | Freq: Once | INTRAMUSCULAR | Status: AC
  Administered 2017-05-24: 40 mg via INTRAVENOUS
  Filled 2017-05-24: qty 4

## 2017-05-24 MED ORDER — DEXTROSE-NACL 5-0.45 % IV SOLN
INTRAVENOUS | Status: DC
  Administered 2017-05-25: 02:00:00 via INTRAVENOUS

## 2017-05-24 MED ORDER — PIPERACILLIN-TAZOBACTAM 3.375 G IVPB
3.3750 g | Freq: Three times a day (TID) | INTRAVENOUS | Status: DC
  Filled 2017-05-24: qty 50

## 2017-05-24 MED ORDER — SODIUM CHLORIDE 0.9 % IV SOLN
INTRAVENOUS | Status: DC
  Administered 2017-05-24: 13.6 [IU]/h via INTRAVENOUS
  Administered 2017-05-24: 6.4 [IU]/h via INTRAVENOUS
  Administered 2017-05-25: 4.6 [IU]/h via INTRAVENOUS
  Filled 2017-05-24 (×2): qty 1

## 2017-05-24 MED ORDER — PIPERACILLIN-TAZOBACTAM 3.375 G IVPB 30 MIN
3.3750 g | Freq: Once | INTRAVENOUS | Status: AC
  Administered 2017-05-24: 3.375 g via INTRAVENOUS
  Filled 2017-05-24: qty 50

## 2017-05-24 MED ORDER — PIPERACILLIN-TAZOBACTAM 3.375 G IVPB
3.3750 g | Freq: Three times a day (TID) | INTRAVENOUS | Status: DC
  Administered 2017-05-25: 3.375 g via INTRAVENOUS
  Filled 2017-05-24 (×3): qty 50

## 2017-05-24 MED ORDER — SODIUM BICARBONATE 8.4 % IV SOLN
INTRAVENOUS | Status: DC
  Administered 2017-05-24 – 2017-05-25 (×3): via INTRAVENOUS
  Filled 2017-05-24 (×6): qty 150

## 2017-05-24 MED ORDER — HEPARIN BOLUS VIA INFUSION
4000.0000 [IU] | Freq: Once | INTRAVENOUS | Status: AC
  Administered 2017-05-24: 4000 [IU] via INTRAVENOUS
  Filled 2017-05-24: qty 4000

## 2017-05-24 MED ORDER — NOREPINEPHRINE BITARTRATE 1 MG/ML IV SOLN
0.0000 ug/min | Freq: Once | INTRAVENOUS | Status: AC
  Administered 2017-05-24: 15 ug/min via INTRAVENOUS
  Filled 2017-05-24: qty 4

## 2017-05-24 MED ORDER — ORAL CARE MOUTH RINSE
15.0000 mL | OROMUCOSAL | Status: DC
  Administered 2017-05-25 (×5): 15 mL via OROMUCOSAL

## 2017-05-24 MED ORDER — EPINEPHRINE PF 1 MG/ML IJ SOLN
0.5000 ug/min | INTRAVENOUS | Status: DC
  Administered 2017-05-24: 5 ug/min via INTRAVENOUS
  Filled 2017-05-24 (×2): qty 4

## 2017-05-24 MED ORDER — LACTATED RINGERS IV BOLUS
1000.0000 mL | Freq: Once | INTRAVENOUS | Status: AC
  Administered 2017-05-24: 1000 mL via INTRAVENOUS

## 2017-05-24 MED ORDER — SODIUM CHLORIDE 0.9 % IV SOLN: 250.0000 mL | INTRAVENOUS | Status: DC | PRN

## 2017-05-24 MED ORDER — PIPERACILLIN-TAZOBACTAM 3.375 G IVPB 30 MIN
3.3750 g | Freq: Once | INTRAVENOUS | Status: DC
  Filled 2017-05-24: qty 50

## 2017-05-24 MED FILL — Medication: Qty: 1 | Status: AC

## 2017-05-24 NOTE — ED Provider Notes (Addendum)
[facility] EMERGENCY DEPARTMENT Provider Note   CSN: [id number] Arrival date & time: 05/31/2017  6195     History   Chief Complaint Chief Complaint  Patient presents with  . Cardiac Arrest    HPI Alexandra Gross is a 84 y.o. female.  The history is provided by the EMS personnel and a relative. The history is limited by the condition of the patient (in cardiac arrest). No language interpreter was used.  Cardiac Arrest  Witnessed by:  Not witnessed (called out to cousin at 26 am who called EMS) Incident location:  Home Time before BLS initiated:  > 5 minutes (Per cousin approximately 45 minutes until she got to the patient and was instructed to start CPR) Time before ALS initiated: 3-5 minutes following cousin arriving at patient's house. Condition upon EMS arrival:  Apneic Pulse:  Absent Initial cardiac rhythm per EMS:  PEA Treatments prior to arrival:  ACLS protocol, intubation and vascular access Medications given prior to ED:  Epinephrine (8 total) Airway: intubation in the field. Rhythm on admission to ED:  Unchanged Associated symptoms comment:  Just stated to family she needed help Risk factors: diabetes mellitus and heart problem   Patient with h/o recent NSTEMI off most of her medication who presents post PEA arrest at home.  Initially had pulse in the ED and then lost it.  CPR resumed immediately 1 epi give with continued circulation and carotid pulse.  EPI drip initiated.  Lasix therapy ordered for pulmonary edema.    Past Medical History:  Diagnosis Date  . Arthritis   . B12 deficiency   . CAD (coronary artery disease)    a. 12/2010 Cath: nonobs dzs;  b. 12/2014 NSTEMI/Cath: LM nl, LAD nl, LCX 90p, 74m OM1 95ost, OM2 small, RCA 50p/d-->PCI of complex LCX/OM lesion considered, felt to be high risk-->Med Rx.  . CHF (congestive heart failure) ([place]   . Chronic kidney disease   . Diabetes mellitus   . Heart [place] ([facility] [place] 12/[place]  . History of  echocardiogram    a.  Echo 11/16: mild LVH, EF 55%, no RWMA, Gr 1 DD, MAC, mild LAE, normal RVF  . Hyperlipidemia   . Hypertension   . Obesity     Patient Active Problem List   Diagnosis Date Noted  . Dyslipidemia 05/20/2017  . Bilateral carotid artery stenosis 12/17/2016  . DOE (dyspnea on exertion) 12/17/2016  . Bradycardia 09/20/2015  . CKD (chronic kidney disease), stage III ([facility] 01/01/2015  . NSTEMI (non-ST elevated myocardial infarction) ([place]   . Unstable angina pectoris ([place] 12/30/2014  . Peripheral edema 05/26/2012  . B12 deficiency 08/18/2011  . Hyperlipidemia 08/18/2011  . CAD (coronary artery disease) 02/16/2011  . Preventative health care 02/10/2011  . HTN (hypertension) 01/05/2011  . Bruit 01/05/2011  . Type 2 diabetes mellitus, controlled, with renal complications ([place] 109/32/6712 . Obesity     Past Surgical History:  Procedure Laterality Date  . ABDOMINAL HYSTERECTOMY  1972  . APPENDECTOMY    . BACK SURGERY    . CARDIAC CATHETERIZATION N/A 12/31/2014   Procedure: Left Heart Cath and Coronary Angiography;  Surgeon: Peter M JMartinique MD;  [facility];  Service: Cardiovascular;  Laterality: N/A;  . CATARACT EXTRACTION Bilateral   . LEFT HEART CATHETERIZATION WITH CORONARY ANGIOGRAM N/A 01/06/2011   Procedure: LEFT HEART CATHETERIZATION WITH CORONARY ANGIOGRAM;  Surgeon: JMinus Breeding MD;  [facility];  Service: Cardiovascular;  Laterality: N/A;  . tumor  removed     Abdomen (benign)     OB History   None      Home Medications    Prior to Admission medications   Medication Sig Start Date End Date Taking? Authorizing Provider  amLODipine (NORVASC) 10 MG tablet Take 1 tablet (10 mg total) by mouth daily. 05/20/17   Minus Breeding, MD  aspirin 81 MG tablet Take 81 mg by mouth daily.    [provider]  atorvastatin (LIPITOR) 40 MG tablet TAKE 1 TABLET BY MOUTH DAILY AT 6 PM. Patient not taking: Reported on 05/20/2017  04/22/17   Minus Breeding, MD  furosemide (LASIX) 20 MG tablet Take 1 tablet (20 mg total) by mouth 2 (two) times a week. OK TO TAKE EXTRA FOR PRN SWELLING Patient not taking: Reported on 05/20/2017 02/18/17   Minus Breeding, MD  isosorbide mononitrate (IMDUR) 60 MG 24 hr tablet TAKE 1.5 TABLETS (90 MG TOTAL) BY MOUTH DAILY. Patient not taking: Reported on 05/20/2017 10/22/16   Barrett, Evelene Croon, PA-C  metFORMIN (GLUCOPHAGE-XR) 500 MG 24 hr tablet Take 2 tablets (1,000 mg total) by mouth 2 (two) times daily. Patient not taking: Reported on 05/20/2017 03/03/17   Biagio Borg, MD  metoprolol succinate (TOPROL-XL) 50 MG 24 hr tablet Take 1 tablet (50 mg total) by mouth daily. Patient not taking: Reported on 05/20/2017 12/17/16   Minus Breeding, MD  nitroGLYCERIN (NITROSTAT) 0.4 MG SL tablet PLACE 1 TABLET (0.4 MG TOTAL) UNDER THE TONGUE EVERY 5 (FIVE) MINUTES AS NEEDED FOR CHEST PAIN. Patient not taking: Reported on 05/20/2017 10/22/16   Barrett, Evelene Croon, PA-C  Omega-3 Fatty Acids (FISH OIL) 1000 MG CAPS Take 1 capsule by mouth daily.    [provider]  Polyethyl Glycol-Propyl Glycol (SYSTANE ULTRA OP) Place 1 drop into both eyes 2 (two) times daily.    [provider]  vitamin B-12 (CYANOCOBALAMIN) 500 MCG tablet Take 1 tablet (500 mcg total) by mouth daily. Patient not taking: Reported on 05/20/2017 01/01/15   Theora Gianotti, NP  VITAMIN E PO Take 1 capsule by mouth daily.      [provider]    Family History Family History  Problem Relation Age of Onset  . Diabetes Mother        Died at 43 but had heart problems (?weak heart in her 52s)  . Arthritis Mother   . Hypertension Mother   . Heart disease Mother   . Stroke Maternal Grandmother   . Stroke Maternal Grandfather   . Colon cancer Neg Hx   . Esophageal cancer Neg Hx   . Stomach cancer Neg Hx   . Rectal cancer Neg Hx     Social History Social History   Tobacco Use  . Smoking status: Former  Smoker    Types: Cigarettes    Last attempt to quit: 02/23/1993    Years since quitting: 24.2  . Smokeless tobacco: Never Used  Substance Use Topics  . Alcohol use: No    Alcohol/week: 0.0 oz  . Drug use: No     Allergies   Lisinopril and Penicillins   Review of Systems Review of Systems  Unable to perform ROS: Acuity of condition  Skin: Negative for wound.     Physical Exam Updated Vital Signs BP (!) 67/37   Pulse 64   Resp 18   Ht _0  (1.6 m)   Wt 90.7 kg (200 lb)   SpO2 90%   BMI 35.43 kg/m  Physical Exam  Constitutional: She appears well-developed and well-nourished.  HENT:  Head: Normocephalic and atraumatic.  Right Ear: External ear normal.  Left Ear: External ear normal.  Nose: Nose normal.  Eyes: Conjunctivae are normal.  Neck: No tracheal deviation present.  Cardiovascular:  Originally no pulses and then carotid pulse post 1 epi in the ED  Pulmonary/Chest: She has rales.  With bagging   Abdominal: She exhibits distension. Bowel sounds are absent.  Musculoskeletal: She exhibits no deformity.  Neurological: She displays normal reflexes. GCS eye subscore is 1. GCS verbal subscore is 1. GCS motor subscore is 1.  Skin: Skin is warm and dry. Capillary refill takes more than 3 seconds. She is not diaphoretic.  Psychiatric:  unable     ED Treatments / Results  Labs (all labs ordered are listed, but only abnormal results are displayed) Results for orders placed or performed during the hospital encounter of 05/30/2017  CBC  Result Value Ref Range   WBC 16.4 (H) 4.0 - 10.5 K/uL   RBC 3.26 (L) 3.87 - 5.11 MIL/uL   Hemoglobin 9.4 (L) 12.0 - 15.0 g/dL   HCT 32.2 (L) 36.0 - 46.0 %   MCV 98.8 78.0 - 100.0 fL   MCH 28.8 26.0 - 34.0 pg   MCHC 29.2 (L) 30.0 - 36.0 g/dL   RDW 14.1 11.5 - 15.5 %   Platelets 167 150 - 400 K/uL  I-Stat Chem 8, ED  Result Value Ref Range   Sodium 142 135 - 145 mmol/L   Potassium 4.2 3.5 - 5.1 mmol/L   Chloride 113 (H) 101 -  111 mmol/L   BUN 19 6 - 20 mg/dL   Creatinine, Ser 1.70 (H) 0.44 - 1.00 mg/dL   Glucose, Bld 415 (H) 65 - 99 mg/dL   Calcium, Ion 1.07 (L) 1.15 - 1.40 mmol/L   TCO2 13 (L) 22 - 32 mmol/L   Hemoglobin 9.9 (L) 12.0 - 15.0 g/dL   HCT 29.0 (L) 36.0 - 46.0 %  I-Stat Troponin, ED (not at [facility])  Result Value Ref Range   Troponin i, poc 0.18 (HH) 0.00 - 0.08 ng/mL   Comment NOTIFIED PHYSICIAN    Comment 3          I-Stat CG4 Lactic Acid, ED  Result Value Ref Range   Lactic Acid, Venous 16.23 (HH) 0.5 - 1.9 mmol/L   Comment NOTIFIED PHYSICIAN    Dg Chest Portable 1 View  Result Date: 06/22/2017 CLINICAL DATA:  Status post CPR with subsequent intubation of the trachea and esophagus. EXAM: PORTABLE CHEST 1 VIEW COMPARISON:  PA chest x-ray of December 30, 2014 FINDINGS: The endotracheal tube tip approximately projects approximately 5.1 cm above the carina. The esophagogastric tube tip and proximal port project below the GE junction. The lungs are well-expanded. The interstitial markings are increased diffusely greatest in the left upper lobe. The cardiac silhouette is enlarged. The pulmonary vascularity is indistinct. External pacemaker defibrillator pads are present. The visualized ribs appear intact. IMPRESSION: Reasonable positioning of the endotracheal and esophagogastric tubes. Bilateral pulmonary vascular congestion and interstitial edema. Mild cardiomegaly. Electronically Signed   By: David  Martinique M.D.   On: 06/20/2017 07:16    EKG EKG Interpretation  Date/Time:  Monday Liese Dizdarevic 01 2019 06:55:48 EDT Ventricular Rate:  66 PR Interval:    QRS Duration: 94 QT Interval:  510 QTC Calculation: 534 R Axis:   -12 Text Interpretation:  Atrial fibrillation Septal infarct , age undetermined Marked ST abnormality, possible inferior  subendocardial injury Marked ST abnormality, possible anterior subendocardial injury Prolonged QT Confirmed by Randal Buba, Ambri Miltner ([id number]) on 06/12/2017 7:02:43 AM   Radiology No  results found.  Procedures Procedures (including critical care time)  Medications Ordered in ED Medications  EPINEPHrine (ADRENALIN) 4 mg in dextrose 5 % 250 mL (0.016 mg/mL) infusion (20 mcg/min Intravenous Rate/Dose Change 06/17/2017 0707)  sodium bicarbonate injection (100 mEq Intravenous Given 06/06/2017 0658)  dextrose 5 %-0.45 % sodium chloride infusion (has no administration in time range)  insulin regular (NOVOLIN R,HUMULIN R) 100 Units in sodium chloride 0.9 % 100 mL (1 Units/mL) infusion (has no administration in time range)  sodium bicarbonate 150 mEq in dextrose 5 % 1,000 mL infusion (has no administration in time range)  furosemide (LASIX) injection 40 mg (has no administration in time range)  norepinephrine (LEVOPHED) 4 mg in dextrose 5 % 250 mL (0.016 mg/mL) infusion (has no administration in time range)  EPINEPHrine (ADRENALIN) 1 MG/10ML injection (1 Syringe Intravenous Given 05/28/2017 3235)     MDM Reviewed: previous chart, nursing note and vitals Reviewed previous: ECG Interpretation: labs, ECG and x-ray (pulmonary edema by me, positive troponin very elevated lactate) Consults: critical care and cardiology (NP on cards, E link )   CRITICAL CARE Performed by: Carlisle Beers Total critical care time: 60  minutes Critical care time was exclusive of separately billable procedures and treating other patients. Critical care was necessary to treat or prevent imminent or life-threatening deterioration. Critical care was time spent personally by me on the following activities: development of treatment plan with patient and/or surrogate as well as nursing, discussions with consultants, evaluation of patient's response to treatment, examination of patient, obtaining history from patient or surrogate, ordering and performing treatments and interventions, ordering and review of laboratory studies, ordering and review of radiographic studies, pulse oximetry and re-evaluation of  patient's condition.   Final Clinical Impressions(s) / ED Diagnoses   Final diagnoses:  Cardiac arrest [facility])   Will admit to ICU, cardiology was consulted and will see the patient.       Damontay Alred, MD 06/06/2017 [place], Margaretha Mahan, MD 06/09/2017 5732

## 2017-05-24 NOTE — Progress Notes (Signed)
RT attempted x 2 to insert radial arterial line.  Blood flashed both times, but unable to thread the catheter.

## 2017-05-24 NOTE — Progress Notes (Addendum)
CRITICAL VALUE ALERT  Critical Value: Troponin   Date & Time Notied:  06/05/2017 20:10  Provider Notified: Dr. Benjamine Mola   Orders Received/Actions taken: Collect in the morning with other labs, and continue to monitor.

## 2017-05-24 NOTE — Progress Notes (Signed)
   05/30/2017 0927  Clinical Encounter Type  Visited With Family;Patient;Health care provider  Visit Type ED  Referral From Chaplain  Consult/Referral To Chaplain  Stress Factors  Family Stress Factors Major life changes   Was handed this family from Chaplain Olaf who was on call last evening.  The cousin and her friend were in Consult A.  Met them and went to check on the patient.  Got permission for family to come back.  Cousin Loretta had a difficult time seeing the patient.  Escorted Loretta to 2M waiting area as patient will move to 2M.  Loretta, the cousin, indicated that patient's grandson in [place] will get a flight here today and that the other grandson who is in [place], on business, has been contacted by family.  Will follow and support as needed. Chaplain Newton Cowan 

## 2017-05-24 NOTE — ED Provider Notes (Signed)
  Physical Exam  BP (!) 154/118 (BP Location: Right Arm)   Pulse 64   Resp 15   Ht 5\' 3"  (1.6 m)   Wt 90.7 kg (200 lb)   SpO2 90%   BMI 35.43 kg/m   Physical Exam  ED Course/Procedures     OG placement Date/Time: 06/14/2017 7:02 AM Performed by: Larene Pickett, PA-C Authorized by: Larene Pickett, PA-C  Consent: Verbal consent not obtained. Required items: required blood products, implants, devices, and special equipment available Patient identity confirmed: arm band Time out: Immediately prior to procedure a "time out" was called to verify the correct patient, procedure, equipment, support staff and site/side marked as required. Local anesthesia used: no  Anesthesia: Local anesthesia used: no  Sedation: Patient sedated: no  Patient tolerance: Patient tolerated the procedure well with no immediate complications    OG tube visualized in the stomach on portable x-ray  MDM         Larene Pickett, PA-C 06/08/2017 [place], April, MD 06/15/2017 1610

## 2017-05-24 NOTE — Progress Notes (Signed)
[facility] for heparin Indication: chest pain/ACS  Allergies  Allergen Reactions  . Lisinopril Other (See Comments)    unknown  . Penicillins Other (See Comments)    Patient Measurements: Height: 5\' 5"  (165.1 cm) Weight: 198 lb 13.7 oz (90.2 kg) IBW/kg (Calculated) : 57 Heparin Dosing Weight: 73 kg  Vital Signs: Temp: 97 F (36.1 C) (04/01 2000) BP: 97/81 (04/01 2000) Pulse Rate: 104 (04/01 2000)  Labs: Recent Labs    06/12/2017 0635 05/29/2017 0643 06/01/2017 1103 06/18/2017 1822 05/28/2017 2145  HGB 9.9* 9.4*  --  11.9*  --   HCT 29.0* 32.2*  --  38.5  --   PLT  --  167  --   --   --   LABPROT 19.3*  --   --   --   --   INR 1.64  --   --   --   --   HEPARINUNFRC  --   --   --   --  0.37  CREATININE 1.70* 1.96*  --   --   --   TROPONINI  --   --  8.07* >65.00*  --     Estimated Creatinine Clearance: 24.1 mL/min (A) (by C-G formula based on SCr of 1.96 mg/dL (H)).  Assessment: 84 yo female s/p PEA arrest on heparin for r/o ACS -Heparin level is at goal on 850 units/hr  Goal of Therapy:  Heparin level 0.3-0.7 units/ml Monitor platelets by anticoagulation protocol: Yes   Plan:  -No heparin changes needed -Daily heparin level and CBC  Hildred Laser, PharmD Clinical Pharmacist 06/16/2017 10:29 PM

## 2017-05-24 NOTE — Progress Notes (Signed)
Pharmacy Antibiotic Note  ABBEE Gross is a 84 y.o. female admitted on 06/04/2017 with suspected aspiraton PNA/sepsis.  Pharmacy has been consulted for Zosyn dosing.  Plan: Zosyn 3.375g IV q8h EI Follow renal function, clinical progression, c/s  Height: 5\' 3"  (160 cm) Weight: 200 lb (90.7 kg) IBW/kg (Calculated) : 52.4  Temp (24hrs), Avg:98.8 F (37.1 C), Min:98.8 F (37.1 C), Max:98.8 F (37.1 C)  Recent Labs  Lab 05/18/17 0937 06/18/2017 0635 05/31/2017 0643  WBC 9.0  --  16.4*  CREATININE 1.38* 1.70* 1.96*  LATICACIDVEN  --  16.23*  --     Estimated Creatinine Clearance: 23.2 mL/min (A) (by C-G formula based on SCr of 1.96 mg/dL (H)).    Allergies  Allergen Reactions  . Lisinopril Other (See Comments)    unknown  . Penicillins Other (See Comments)    Antimicrobials this admission: Zosyn 4/1 >>   Dose adjustments this admission: n/a  Microbiology results: 4/1 BCx:   UCx:    Sputum:    Thank you for allowing pharmacy to be a part of this patient's care.  Zainab Crumrine D. Lynise Porr, PharmD, BCPS Clinical Pharmacist Clinical Phone for 06/12/2017 until 3:30pm: [phone] If after 3:30pm, please call main pharmacy at [phone] 06/01/2017 9:15 AM

## 2017-05-24 NOTE — ED Triage Notes (Signed)
BIB EMS from home, CPR ongoing. Pt called her cousin this AM approx 0500 stating "she needed help." When her cousin arrived pt was found on the bed unresponsive. Fire arrived, pt was apneic and pulseless, CPR initiated. Ongoing for 45 minutes, received 9 epi. Intubated in field, capnography >90, CBG 166.

## 2017-05-24 NOTE — Procedures (Signed)
Central Venous Catheter Insertion Procedure Note Alexandra Gross [id number] [birth year]  Procedure: Insertion of Central Venous Catheter Indications: Assessment of intravascular volume, Drug and/or fluid administration and Frequent blood sampling  Procedure Details Consent: Unable to obtain consent because of emergent medical necessity. Time Out: Verified patient identification, verified procedure, site/side was marked, verified correct patient position, special equipment/implants available, medications/allergies/relevent history reviewed, required imaging and test results available.  Performed  Maximum sterile technique was used including antiseptics, cap, gloves, gown, hand hygiene, mask and sheet. Skin prep: Chlorhexidine; local anesthetic administered A antimicrobial bonded/coated triple lumen catheter was placed in the left internal jugular vein using the Seldinger technique.  Evaluation Blood flow good Complications: No apparent complications Patient did tolerate procedure well. Chest X-ray ordered to verify placement.  CXR: pending.  Procedure performed under direct ultrasound guidance for real time vessel cannulation.      Montey Hora, [place] [facility] Pager: [phone]  or [phone] 05/28/2017, 9:16 AM

## 2017-05-24 NOTE — Consult Note (Addendum)
PULMONARY / CRITICAL CARE MEDICINE   Name: Alexandra Gross MRN: [id number] DOB: [birth year]    ADMISSION DATE:  06/08/2017 CONSULTATION DATE:  05/29/2017  REFERRING MD:  EDP  CHIEF COMPLAINT:  Cardiac arrest  HISTORY OF PRESENT ILLNESS:   84 year old with history of coronary artery disease, CHF, chronic kidney disease, diabetes.  Being followed up with cardiology for history of NSTEMI, single-vessel coronary artery disease managed medically.  History of hemorrhoidal GI bleed so Plavix was stopped.  Seen by cardiology on 3/28 when it was noted that she had stopped taking all her medications.  Presents to the ED with PEA cardiac arrest.  She called her family 5 AM today morning saying she was not feeling well.  She was probably down for at least 45 min before her cousin arrived at home. EMS called and underwent CPR for additional approximately 45 minutes.  Currently intubated, on epi drip, insulin drip.  Labs significant for severe lactic acidosis, hypothermia to 95. PCCM called for consultation.  PAST MEDICAL HISTORY :  She  has a past medical history of Arthritis, B12 deficiency, CAD (coronary artery disease), CHF (congestive heart failure) ([place]), Chronic kidney disease, Diabetes mellitus, Heart attack ([place]) (12/2015), History of echocardiogram, Hyperlipidemia, Hypertension, and Obesity.  PAST SURGICAL HISTORY: She  has a past surgical history that includes Appendectomy; tumor removed; Back surgery; Abdominal hysterectomy (1972); left heart catheterization with coronary angiogram (N/A, 01/06/2011); Cardiac catheterization (N/A, 12/31/2014); and Cataract extraction (Bilateral).  Allergies  Allergen Reactions  . Lisinopril Other (See Comments)    unknown  . Penicillins Other (See Comments)    No current facility-administered medications on file prior to encounter.    Current Outpatient Medications on File Prior to Encounter  Medication Sig  . amLODipine (NORVASC) 10 MG tablet Take 1 tablet (10  mg total) by mouth daily.  Marland Kitchen aspirin 81 MG tablet Take 81 mg by mouth daily.  Marland Kitchen atorvastatin (LIPITOR) 40 MG tablet TAKE 1 TABLET BY MOUTH DAILY AT 6 PM. (Patient not taking: Reported on 05/20/2017)  . furosemide (LASIX) 20 MG tablet Take 1 tablet (20 mg total) by mouth 2 (two) times a week. OK TO TAKE EXTRA FOR PRN SWELLING (Patient not taking: Reported on 05/20/2017)  . isosorbide mononitrate (IMDUR) 60 MG 24 hr tablet TAKE 1.5 TABLETS (90 MG TOTAL) BY MOUTH DAILY. (Patient not taking: Reported on 05/20/2017)  . metFORMIN (GLUCOPHAGE-XR) 500 MG 24 hr tablet Take 2 tablets (1,000 mg total) by mouth 2 (two) times daily. (Patient not taking: Reported on 05/20/2017)  . metoprolol succinate (TOPROL-XL) 50 MG 24 hr tablet Take 1 tablet (50 mg total) by mouth daily. (Patient not taking: Reported on 05/20/2017)  . nitroGLYCERIN (NITROSTAT) 0.4 MG SL tablet PLACE 1 TABLET (0.4 MG TOTAL) UNDER THE TONGUE EVERY 5 (FIVE) MINUTES AS NEEDED FOR CHEST PAIN. (Patient not taking: Reported on 05/20/2017)  . Omega-3 Fatty Acids (FISH OIL) 1000 MG CAPS Take 1 capsule by mouth daily.  Vladimir Faster Glycol-Propyl Glycol (SYSTANE ULTRA OP) Place 1 drop into both eyes 2 (two) times daily.  . vitamin B-12 (CYANOCOBALAMIN) 500 MCG tablet Take 1 tablet (500 mcg total) by mouth daily. (Patient not taking: Reported on 05/20/2017)  . VITAMIN E PO Take 1 capsule by mouth daily.      FAMILY HISTORY:  Her indicated that her mother is deceased. She indicated that her father is deceased. She indicated that her maternal grandmother is deceased. She indicated that her maternal grandfather is deceased. She indicated that  her paternal grandmother is deceased. She indicated that her paternal grandfather is deceased. She indicated that the status of her neg hx is unknown.   SOCIAL HISTORY: She  reports that she quit smoking about 24 years ago. Her smoking use included cigarettes. She has never used smokeless tobacco. She reports that she does  not drink alcohol or use drugs.  REVIEW OF SYSTEMS:   Unable to obtain  SUBJECTIVE:    VITAL SIGNS: BP (!) 67/37   Pulse 64   Temp 98.8 F (37.1 C) (Rectal)   Resp 18   Ht 5\' 3"  (1.6 m)   Wt 200 lb (90.7 kg)   SpO2 90%   BMI 35.43 kg/m    HEMODYNAMICS:    VENTILATOR SETTINGS: Vent Mode: PRVC FiO2 (%):  [100 %] 100 % Set Rate:  [18 bmp] 18 bmp Vt Set:  [500 mL] 500 mL PEEP:  [5 cmH20] 5 cmH20 Plateau Pressure:  [23 cmH20] 23 cmH20  INTAKE / OUTPUT: No intake/output data recorded.  PHYSICAL EXAMINATION: Gen:      No acute distress HEENT:  EOMI, sclera anicteric, ETT tube Neck:     No masses; no thyromegaly Lungs:    Clear to auscultation bilaterally; normal respiratory effort CV:         Regular rate and rhythm; no murmurs Abd:      + bowel sounds; soft, non-tender; no palpable masses, no distension Ext:    No edema; adequate peripheral perfusion Skin:      Warm and dry; no rash Neuro: Comatose, unresponsive  LABS:  BMET Recent Labs  Lab 05/18/17 0937 06/02/2017 0635 06/09/2017 0643  NA 140 142 142  K 4.4 4.2 4.3  CL 108 113* 111  CO2 26  --  11*  BUN 23 19 17   CREATININE 1.38* 1.70* 1.96*  GLUCOSE 252* 415* 429*    Electrolytes Recent Labs  Lab 05/18/17 0937 06/15/2017 0643  CALCIUM 9.2 8.3*    CBC Recent Labs  Lab 05/18/17 0937 05/30/2017 0635 06/06/2017 0643  WBC 9.0  --  16.4*  HGB 11.9* 9.9* 9.4*  HCT 35.9* 29.0* 32.2*  PLT 152.0  --  167    Coag's No results for input(s): APTT, INR in the last 168 hours.  Sepsis Markers Recent Labs  Lab 06/01/2017 0635  LATICACIDVEN 16.23*    ABG Recent Labs  Lab 05/27/2017 0754  PHART 6.995*  PCO2ART 39.7  PO2ART 286.0*    Liver Enzymes Recent Labs  Lab 05/18/17 0937 06/07/2017 0643  AST 22 50*  ALT 19 33  ALKPHOS 72 60  BILITOT 0.5 0.4  ALBUMIN 3.8 2.4*    Cardiac Enzymes No results for input(s): TROPONINI, PROBNP in the last 168 hours.  Glucose No results for input(s): GLUCAP  in the last 168 hours.  Imaging Dg Chest Portable 1 View  Result Date: 05/24/2017 CLINICAL DATA:  Status post CPR with subsequent intubation of the trachea and esophagus. EXAM: PORTABLE CHEST 1 VIEW COMPARISON:  PA chest x-ray of December 30, 2014 FINDINGS: The endotracheal tube tip approximately projects approximately 5.1 cm above the carina. The esophagogastric tube tip and proximal port project below the GE junction. The lungs are well-expanded. The interstitial markings are increased diffusely greatest in the left upper lobe. The cardiac silhouette is enlarged. The pulmonary vascularity is indistinct. External pacemaker defibrillator pads are present. The visualized ribs appear intact. IMPRESSION: Reasonable positioning of the endotracheal and esophagogastric tubes. Bilateral pulmonary vascular congestion and interstitial edema. Mild cardiomegaly. Electronically  Signed   By: David  Martinique M.D.   On: 06/06/2017 07:16     STUDIES:    CULTURES:   ANTIBIOTICS:   SIGNIFICANT EVENTS:   LINES/TUBES:   DISCUSSION: 84 year old with PEA arrest, prolonged downtime History significant for coronary artery disease, diabetes mellitus, noncompliance with medication. Concern for anoxic brain injury  ASSESSMENT / PLAN:  PULMONARY A: Acute respiratory failure status post cardiac arrest Pulmonary edema, volume overload P:   Continue vent support Increased respiratory rate 24 to help correct acidosis Follow chest x-ray, ABG  CARDIOVASCULAR A:  PEA arrest, coronary artery disease P:  Follow EKG, troponin Cardiology on board. Bicarb drip Transition epi to levophed  RENAL A:   Acute kidney injury P:   Follow urine output and creatinine  GASTROINTESTINAL A:   No acute issues P:   Keep n.p.o.  HEMATOLOGIC A:   Leukocytosis, likely stress P:  Follow CBC  INFECTIOUS A:   Possible infection but no clear source P:   Empiric Zosyn Follow cultures  ENDOCRINE A:    Hypoglycemia, diabetes P:   Insulin drip  NEUROLOGIC A:   Concern for anoxic encephalopathy P:   Monitor neuro status EEG Defer cooling protocol due to prolonged downtime, pt is hypothermic on presentation.  FAMILY  - Updates: Spoke with cousin Guerry Minors at bedside.  Her next of kin is 2 grandsons in [place] and other who was traveling in [place].  We spoke to the grandson in [place] and expressed concern about severe anoxic brain injury.  Recommended DNR status and no escalation of care.  He would like to discuss this with his brother before making a final decision. - Inter-disciplinary family meet or Palliative Care meeting due by: 05/31/17  The patient is critically ill with multiple organ system failure and requires high complexity decision making for assessment and support, frequent evaluation and titration of therapies, advanced monitoring, review of radiographic studies and interpretation of complex data.   Critical Care Time devoted to patient care services, exclusive of separately billable procedures, described in this note is 100 minutes.   Marshell Garfinkel MD [facility] Pager [phone] If no answer or after 3pm call: [phone] 05/28/2017, 8:28 AM

## 2017-05-24 NOTE — Progress Notes (Signed)
Pt transported from ED to 2M14. Pt's vitals remained stable throughout the trip.  Unit RT met at bedside.

## 2017-05-24 NOTE — Progress Notes (Signed)
ANTICOAGULATION CONSULT NOTE - Initial Consult  Pharmacy Consult for heparin Indication: chest pain/ACS  Allergies  Allergen Reactions  . Lisinopril Other (See Comments)    unknown  . Penicillins Other (See Comments)    Patient Measurements: Height: 5\' 3"  (160 cm) Weight: 200 lb (90.7 kg) IBW/kg (Calculated) : 52.4 Heparin Dosing Weight: 73 kg  Vital Signs: Temp: 91.9 F (33.3 C) (04/01 1208) Temp Source: Rectal (04/01 8032) BP: 103/64 (04/01 1208) Pulse Rate: 81 (04/01 1208)  Labs: Recent Labs    05/30/2017 0635 05/31/2017 0643 06/19/2017 1103  HGB 9.9* 9.4*  --   HCT 29.0* 32.2*  --   PLT  --  167  --   LABPROT 19.3*  --   --   INR 1.64  --   --   CREATININE 1.70* 1.96*  --   TROPONINI  --   --  8.07*    Estimated Creatinine Clearance: 23.2 mL/min (A) (by C-G formula based on SCr of 1.96 mg/dL (H)).  Assessment: CC/HPI: PEA cardiac arrest  PMH: CAD/NSTEMI, CHF, CKD, DM  Anticoag: none pta - iv hep for r/o acs  CV: PEA arrest with ROSC  - Levophed @25  - MAP 70s, NSR -?STEMI per cards - IV heparin   Renal: SCr 1.96, crcl 20-13mL/min. Very acidotic -NaBicarb @100 /hr  Heme/Onc: H&H 9.4/32.2, Plt 167, INR 1.64  Goal of Therapy:  Heparin level 0.3-0.7 units/ml Monitor platelets by anticoagulation protocol: Yes   Plan:  -Heparin bolus 4000 units x 1 -heparin gtt 850 units/hr -initial lvl 2200 -daily hep lvl cbc  Levester Fresh, PharmD, BCPS, BCCCP Clinical Pharmacist Clinical phone for 05/31/2017 from 7a-3:30p: [phone] If after 3:30p, please call main pharmacy at: [phone] 06/17/2017 1:08 PM

## 2017-05-24 NOTE — Consult Note (Addendum)
Cardiology Consultation:   Patient ID: Alexandra Gross; [id number]; [birth year]   Admit date: 06/22/2017 Date of Consult: 06/07/2017  Primary Care Provider: Biagio Borg, MD Primary Cardiologist: No primary care provider on file. [place]  Primary Electrophysiologist:  n/a   Patient Profile:   Alexandra Gross is a 84 y.o. female with a hx of coronary artery disease who is being seen today for the evaluation of cardiopulmonary arrest/pulseless electric activity at the request of Dr.Mannam.  History of Present Illness:   Alexandra Gross is a 84 year old obese diabetic woman and has a known history of coronary artery disease with a non-STEMI in 2016.  She had a high grade stenosis in a tortuous left circumflex coronary artery and the decision was made to treat her medically.  She has not required further coronary evaluation since that time.  She reportedly called family around 0500 hrs. because she was on well and stated that "she needed help".  It took her family member roughly 45 minutes to get to her and when they arrived found her pulseless and apneic.  EMS had already been called and when they arrived the initial rhythm was pulseless electrical activity. They performed CPR for 45 minutes and gave repeated doses of epinephrine until return of spontaneous circulation.  Patient is currently unconscious and intubated.  She does not respond to pain. She is hypothermic with a temperature of 95 F.  Requires norepi IV to maintain BP. She is severely acidotic with a lactic acid of 16 and arterial pH of 6.99.  Glucose is greater than 400.  Her ECG shows probable junctional rhythm with widespread ST depression, but with a suggestion of ST elevation in V1-V2. QS pattern in V1-V2 is new. R waves were seen there on last ECG from August 2018.   Past Medical History:  Diagnosis Date  . Arthritis   . B12 deficiency   . CAD (coronary artery disease)    a. 12/2010 Cath: nonobs dzs;  b. 12/2014 NSTEMI/Cath: LM nl, LAD  nl, LCX 90p, 73m OM1 95ost, OM2 small, RCA 50p/d-->PCI of complex LCX/OM lesion considered, felt to be high risk-->Med Rx.  . [place]   . Chronic kidney disease   . Diabetes mellitus   . [place] attack [place] 12[place]  . History of echocardiogram    a.  Echo 11/16: mild LVH, EF 55%, no RWMA, Gr 1 DD, MAC, mild LAE, normal RVF  . Hyperlipidemia   . Hypertension   . Obesity     Past Surgical History:  Procedure Laterality Date  . ABDOMINAL HYSTERECTOMY  1972  . APPENDECTOMY    . BACK SURGERY    . CARDIAC CATHETERIZATION N/A 12/31/2014   Procedure: Left Heart Cath and Coronary Angiography;  Surgeon: Peter M JMartinique MD;  [facility];  Service: Cardiovascular;  Laterality: N/A;  . CATARACT EXTRACTION Bilateral   . LEFT HEART CATHETERIZATION WITH CORONARY ANGIOGRAM N/A 01/06/2011   Procedure: LEFT HEART CATHETERIZATION WITH CORONARY ANGIOGRAM;  Surgeon: JMinus Breeding MD;  [facility];  Service: Cardiovascular;  Laterality: N/A;  . tumor removed     Abdomen (benign)     Home Medications:  Prior to Admission medications   Medication Sig Start Date End Date Taking? Authorizing Provider  amLODipine (NORVASC) 10 MG tablet Take 1 tablet (10 mg total) by mouth daily. 05/20/17  Yes HMinus Breeding MD  aspirin 81 MG tablet Take 81 mg by mouth daily.   Yes [provider]  metFORMIN (GLUCOPHAGE-XR) 500 MG 24 hr tablet Take 2 tablets (1,000 mg total) by mouth 2 (two) times daily. 03/03/17  Yes Biagio Borg, MD  Multiple Vitamins-Minerals (PRESERVISION AREDS PO) Take 1 tablet by mouth 2 (two) times daily.   Yes [provider]  Omega-3 Fatty Acids (FISH OIL) 1000 MG CAPS Take 1 capsule by mouth daily.   Yes [provider]  atorvastatin (LIPITOR) 40 MG tablet TAKE 1 TABLET BY MOUTH DAILY AT 6 PM. Patient not taking: Reported on 05/20/2017 04/22/17   Minus Breeding, MD  furosemide (LASIX) 20 MG tablet Take 1 tablet (20 mg  total) by mouth 2 (two) times a week. OK TO TAKE EXTRA FOR PRN SWELLING Patient not taking: Reported on 05/20/2017 02/18/17   Minus Breeding, MD  isosorbide mononitrate (IMDUR) 60 MG 24 hr tablet TAKE 1.5 TABLETS (90 MG TOTAL) BY MOUTH DAILY. Patient not taking: Reported on 05/20/2017 10/22/16   Barrett, Evelene Croon, PA-C  metoprolol succinate (TOPROL-XL) 50 MG 24 hr tablet Take 1 tablet (50 mg total) by mouth daily. Patient not taking: Reported on 05/20/2017 12/17/16   Minus Breeding, MD  nitroGLYCERIN (NITROSTAT) 0.4 MG SL tablet PLACE 1 TABLET (0.4 MG TOTAL) UNDER THE TONGUE EVERY 5 (FIVE) MINUTES AS NEEDED FOR CHEST PAIN. Patient not taking: Reported on 05/20/2017 10/22/16   Barrett, Evelene Croon, PA-C  Polyethyl Glycol-Propyl Glycol (SYSTANE ULTRA OP) Place 1 drop into both eyes 2 (two) times daily.    [provider]  vitamin B-12 (CYANOCOBALAMIN) 500 MCG tablet Take 1 tablet (500 mcg total) by mouth daily. Patient not taking: Reported on 05/20/2017 01/01/15   Theora Gianotti, NP  VITAMIN E PO Take 1 capsule by mouth daily.      [provider]    Inpatient Medications: Scheduled Meds: . furosemide  40 mg Intravenous Once   Continuous Infusions: . dextrose 5 % and 0.45% NaCl    . epinephrine Stopped (05/30/2017 0753)  . insulin (NOVOLIN-R) infusion 3.2 Units/hr (06/15/2017 0757)  .  sodium bicarbonate  infusion 1000 mL 100 mL/hr at 06/19/2017 0802   PRN Meds: sodium bicarbonate  Allergies:    Allergies  Allergen Reactions  . Lisinopril Other (See Comments)    unknown  . Penicillins Other (See Comments)    Social History:   Social History   Socioeconomic History  . Marital status: Widowed    Spouse name: Not on file  . Number of children: 1  . Years of education: Not on file  . Highest education level: Not on file  Occupational History  . Occupation: Retired  Scientific laboratory technician  . Financial resource strain: Not on file  . Food insecurity:    Worry: Not on file     Inability: Not on file  . Transportation needs:    Medical: Not on file    Non-medical: Not on file  Tobacco Use  . Smoking status: Former Smoker    Types: Cigarettes    Last attempt to quit: 02/23/1993    Years since quitting: 24.2  . Smokeless tobacco: Never Used  Substance and Sexual Activity  . Alcohol use: No    Alcohol/week: 0.0 oz  . Drug use: No  . Sexual activity: Not on file  Lifestyle  . Physical activity:    Days per week: Not on file    Minutes per session: Not on file  . Stress: Not on file  Relationships  . Social connections:    Talks on phone:  Not on file    Gets together: Not on file    Attends religious service: Not on file    Active member of club or organization: Not on file    Attends meetings of clubs or organizations: Not on file    Relationship status: Not on file  . Intimate partner violence:    Fear of current or ex partner: Not on file    Emotionally abused: Not on file    Physically abused: Not on file    Forced sexual activity: Not on file  Other Topics Concern  . Not on file  Social History Narrative   Lives alone.  2 grandchildren.  1 great grand.    Family History:    Family History  Problem Relation Age of Onset  . Diabetes Mother        Died at 2 but had heart problems (?weak heart in her 34s)  . Arthritis Mother   . Hypertension Mother   . Heart disease Mother   . Stroke Maternal Grandmother   . Stroke Maternal Grandfather   . Colon cancer Neg Hx   . Esophageal cancer Neg Hx   . Stomach cancer Neg Hx   . Rectal cancer Neg Hx      ROS:  Please see the history of present illness.   Cannot obtain ROS.    Physical Exam/Data:   Vitals:   05/26/2017 0648 06/09/2017 0655 06/22/2017 0700 05/26/2017 0705  BP: (!) 154/118  90/61 (!) 67/37  Pulse: 64     Resp: 15  (!) 21 18  Temp: 98.8 F (37.1 C)     TempSrc: Rectal     SpO2: 90%     Weight:  200 lb (90.7 kg)    Height:  5' 3"  (1.6 m)     No intake or output data in the 24  hours ending 06/22/2017 0805 Filed Weights   06/19/2017 0655  Weight: 200 lb (90.7 kg)   Body mass index is 35.43 kg/m.  General:  Unresponsive to verbal or painful stimuli, obese. Intubated HEENT: normal Lymph: no adenopathy Neck: no JVD Endocrine:  No thryomegaly Vascular: No carotid bruits; FA pulses 2+ bilaterally without bruits  Cardiac:  normal S1, S2; RRR; no murmur  Lungs:  clear to auscultation bilaterally, no wheezing, rhonchi or rales  Abd: soft, nontender, no hepatomegaly  Ext: no edema Musculoskeletal:  No deformities, BUE and BLE strength normal and equal Skin: warm and dry  Neuro:  Unable to assess - no gag or corneal reflex detected, not breathing over vent. Pupils are not midriatic, but appear fixed Psych:  Normal affect   EKG:  The EKG was personally reviewed and demonstrates:  Junctional rhythm, anteroseptal infarct, probably acute, widespread ST depression.global ischemia Telemetry:  Telemetry was personally reviewed and demonstrates:  Junctional rhythm  Relevant CV Studies: Echo 10/29/2016  - Left ventricle: The cavity size was normal. Wall thickness was   normal. Systolic function was normal. The estimated ejection   fraction was in the range of 55% to 60%. Wall motion was normal;   there were no regional wall motion abnormalities. Doppler   parameters are consistent with abnormal left ventricular   relaxation (grade 1 diastolic dysfunction). - Mitral valve: Calcified annulus. There was mild regurgitation. - Left atrium: The atrium was mildly dilated.  Laboratory Data:  Chemistry Recent Labs  Lab 05/18/17 [phone] 06/20/2017 0635 06/06/2017 0643  NA 140 142 142  K 4.4 4.2 4.3  CL 108 113* 111  CO2 26  --  11*  GLUCOSE 252* 415* 429*  BUN 23 19 17   CREATININE 1.38* 1.70* 1.96*  CALCIUM 9.2  --  8.3*  GFRNONAA  --   --  22*  GFRAA  --   --  26*  ANIONGAP  --   --  20*    Recent Labs  Lab 05/18/17 0937 05/30/2017 0643  PROT 7.7 5.2*  ALBUMIN 3.8 2.4*   AST 22 50*  ALT 19 33  ALKPHOS 72 60  BILITOT 0.5 0.4   Hematology Recent Labs  Lab 05/18/17 0937 06/09/2017 0635 06/12/2017 0643  WBC 9.0  --  16.4*  RBC 4.15  --  3.26*  HGB 11.9* 9.9* 9.4*  HCT 35.9* 29.0* 32.2*  MCV 86.6  --  98.8  MCH  --   --  28.8  MCHC 33.1  --  29.2*  RDW 14.2  --  14.1  PLT 152.0  --  167   Cardiac EnzymesNo results for input(s): TROPONINI in the last 168 hours.  Recent Labs  Lab 06/06/2017 0633  TROPIPOC 0.18*    BNPNo results for input(s): BNP, PROBNP in the last 168 hours.  DDimer No results for input(s): DDIMER in the last 168 hours.  Radiology/Studies:  Dg Chest Portable 1 View  Result Date: 06/13/2017 CLINICAL DATA:  Status post CPR with subsequent intubation of the trachea and esophagus. EXAM: PORTABLE CHEST 1 VIEW COMPARISON:  PA chest x-ray of December 30, 2014 FINDINGS: The endotracheal tube tip approximately projects approximately 5.1 cm above the carina. The esophagogastric tube tip and proximal port project below the GE junction. The lungs are well-expanded. The interstitial markings are increased diffusely greatest in the left upper lobe. The cardiac silhouette is enlarged. The pulmonary vascularity is indistinct. External pacemaker defibrillator pads are present. The visualized ribs appear intact. IMPRESSION: Reasonable positioning of the endotracheal and esophagogastric tubes. Bilateral pulmonary vascular congestion and interstitial edema. Mild cardiomegaly. Electronically Signed   By: David  Martinique M.D.   On: 06/04/2017 07:16    Assessment and Plan:   1. Probable anteroseptal STEMI:  Unfortunately, her neurological and overall clinical status is very poor. Discussed with Dr. Angelena Form. Not a good candidate for cardiac cath and emergency PCI at this time. Will reconsider if there is meaningful neurological recovery. Meanwhile would treat with ASA, load clopidogrel 300 mg per tube and IV heparin. 2. Cardiogenic shock: on pressors, severely  acidotic. Prognosis is very poor with PEA as original rhythm and prolonged resuscitation time. 3. Acidosis: largely lactic acidosis due to poor perfusion, but could have superimposed DKA. 4. Acute on chronic renal failure  CRITICAL CARE Performed by: Dani Gobble Jemiah Ellenburg   Total critical care time: 55 minutes  Critical care time was exclusive of separately billable procedures and treating other patients.  Critical care was necessary to treat or prevent imminent or life-threatening deterioration.  Critical care was time spent personally by me on the following activities: development of treatment plan with patient and/or surrogate as well as nursing, discussions with consultants, evaluation of patient's response to treatment, examination of patient, obtaining history from patient or surrogate, ordering and performing treatments and interventions, ordering and review of laboratory studies, ordering and review of radiographic studies, pulse oximetry and re-evaluation of patient's condition.   For questions or updates, please contact [place] Please consult [email or web address] for contact info under Cardiology/STEMI.   Signed, Sanda Klein, MD  06/12/2017 8:05 AM

## 2017-05-24 NOTE — Progress Notes (Signed)
Chaplain responded to ED page to be present to patient cousin in consult room A.   Offered pastorl presence and conversation to cousin of patient, Alexandra Gross.  Talked about patient and events of morning.  Offered support during conversations with care team.  Alexandra Gross is cousin to patient, patient has no living children, a grandson Marinus Maw lives with the patient but is travelling in [place].  Another grandson is in [place], [place].  Alexandra Gross able to reach grandson in [place] for conversations on process of care for patient.  Tyreese WAS able to reach [place] in [place], but was cut off for now.  He will forward further info to [facility] as able  Alexandra Gross cell phone # is [phone]  Relayed updates to care team and spoke to [facility] about transition to next chaplain as this chaplain is ending shift at hospital.

## 2017-05-24 DEATH — deceased

## 2017-05-25 ENCOUNTER — Inpatient Hospital Stay (HOSPITAL_COMMUNITY): Payer: Medicare Other

## 2017-05-25 LAB — GLUCOSE, CAPILLARY
GLUCOSE-CAPILLARY: 125 mg/dL — AB (ref 65–99)
GLUCOSE-CAPILLARY: 132 mg/dL — AB (ref 65–99)
GLUCOSE-CAPILLARY: 128 mg/dL — AB (ref 65–99)
GLUCOSE-CAPILLARY: 135 mg/dL — AB (ref 65–99)
GLUCOSE-CAPILLARY: 192 mg/dL — AB (ref 65–99)
GLUCOSE-CAPILLARY: 208 mg/dL — AB (ref 65–99)
Glucose-Capillary: 111 mg/dL — ABNORMAL HIGH (ref 65–99)
Glucose-Capillary: 126 mg/dL — ABNORMAL HIGH (ref 65–99)
Glucose-Capillary: 147 mg/dL — ABNORMAL HIGH (ref 65–99)
Glucose-Capillary: 176 mg/dL — ABNORMAL HIGH (ref 65–99)

## 2017-05-25 LAB — CBC
HCT: 35.9 % — ABNORMAL LOW (ref 36.0–46.0)
Hemoglobin: 11.7 g/dL — ABNORMAL LOW (ref 12.0–15.0)
MCH: 28 pg (ref 26.0–34.0)
MCHC: 32.6 g/dL (ref 30.0–36.0)
MCV: 85.9 fL (ref 78.0–100.0)
PLATELETS: 139 10*3/uL — AB (ref 150–400)
RBC: 4.18 MIL/uL (ref 3.87–5.11)
RDW: 14.1 % (ref 11.5–15.5)
WBC: 12.1 10*3/uL — ABNORMAL HIGH (ref 4.0–10.5)

## 2017-05-25 LAB — BASIC METABOLIC PANEL
Anion gap: 16 — ABNORMAL HIGH (ref 5–15)
BUN: 22 mg/dL — AB (ref 6–20)
CALCIUM: 7.3 mg/dL — AB (ref 8.9–10.3)
CO2: 19 mmol/L — ABNORMAL LOW (ref 22–32)
Chloride: 102 mmol/L (ref 101–111)
Creatinine, Ser: 3.1 mg/dL — ABNORMAL HIGH (ref 0.44–1.00)
GFR calc Af Amer: 15 mL/min — ABNORMAL LOW (ref >60)
GFR, EST NON AFRICAN AMERICAN: 13 mL/min — AB (ref >60)
Glucose, Bld: 128 mg/dL — ABNORMAL HIGH (ref 65–99)
Potassium: 3.5 mmol/L (ref 3.5–5.1)
SODIUM: 137 mmol/L (ref 135–145)

## 2017-05-25 LAB — POCT I-STAT 3, ART BLOOD GAS (G3+)
Acid-base deficit: 4 mmol/L — ABNORMAL HIGH (ref 0.0–2.0)
Bicarbonate: 17.6 mmol/L — ABNORMAL LOW (ref 20.0–28.0)
O2 SAT: 93 %
PCO2 ART: 22.5 mmHg — AB (ref 32.0–48.0)
PH ART: 7.502 — AB (ref 7.350–7.450)
PO2 ART: 60 mmHg — AB (ref 83.0–108.0)
Patient temperature: 98.6
TCO2: 18 mmol/L — ABNORMAL LOW (ref 22–32)

## 2017-05-25 LAB — PHOSPHORUS: Phosphorus: 1.6 mg/dL — ABNORMAL LOW (ref 2.5–4.6)

## 2017-05-25 LAB — HEPARIN LEVEL (UNFRACTIONATED): HEPARIN UNFRACTIONATED: 0.45 [IU]/mL (ref 0.30–0.70)

## 2017-05-25 LAB — TROPONIN I
Troponin I: >65 ng/mL (ref <0.03)
Troponin I: >65 ng/mL (ref <0.03)
Troponin I: >65 ng/mL (ref <0.03)

## 2017-05-25 LAB — MAGNESIUM: MAGNESIUM: 1.2 mg/dL — AB (ref 1.7–2.4)

## 2017-05-25 MED ORDER — MORPHINE 100MG IN NS 100ML (1MG/ML) PREMIX INFUSION
0.0000 mg/h | INTRAVENOUS | Status: DC
  Administered 2017-05-25: 1 mg/h via INTRAVENOUS
  Filled 2017-05-25 (×2): qty 100

## 2017-05-25 MED ORDER — SODIUM CHLORIDE 0.9 % IV SOLN
1.5000 g | Freq: Two times a day (BID) | INTRAVENOUS | Status: DC
  Filled 2017-05-25: qty 1.5

## 2017-05-25 MED ORDER — MORPHINE 100MG IN NS 100ML (1MG/ML) PREMIX INFUSION: 1.0000 mg/h | INTRAVENOUS | Status: DC

## 2017-05-25 MED ORDER — MAGNESIUM SULFATE 2 GM/50ML IV SOLN
2.0000 g | Freq: Once | INTRAVENOUS | Status: AC
  Administered 2017-05-25: 2 g via INTRAVENOUS
  Filled 2017-05-25: qty 50

## 2017-05-25 MED ORDER — MORPHINE BOLUS VIA INFUSION
1.0000 mg | INTRAVENOUS | Status: DC | PRN
  Filled 2017-05-25: qty 1

## 2017-05-25 MED ORDER — PANTOPRAZOLE SODIUM 40 MG PO PACK
40.0000 mg | PACK | Freq: Every day | ORAL | Status: DC
  Filled 2017-05-25: qty 20

## 2017-05-25 MED ORDER — POTASSIUM PHOSPHATES 15 MMOLE/5ML IV SOLN
30.0000 mmol | Freq: Once | INTRAVENOUS | Status: AC
  Administered 2017-05-25: 30 mmol via INTRAVENOUS
  Filled 2017-05-25: qty 10

## 2017-05-25 MED ORDER — MORPHINE 100MG IN NS 100ML (1MG/ML) PREMIX INFUSION
1.0000 mg/h | INTRAVENOUS | Status: DC
Start: 2017-05-25 — End: 2017-05-25
  Administered 2017-05-25: 1 mg/h via INTRAVENOUS
  Filled 2017-05-25 (×2): qty 100

## 2017-05-26 ENCOUNTER — Telehealth: Payer: Self-pay

## 2017-05-26 LAB — BLOOD CULTURE ID PANEL (REFLEXED)
Acinetobacter baumannii: NOT DETECTED
CANDIDA GLABRATA: NOT DETECTED
Candida albicans: NOT DETECTED
Candida krusei: NOT DETECTED
Candida parapsilosis: NOT DETECTED
Candida tropicalis: NOT DETECTED
ENTEROBACTER CLOACAE COMPLEX: NOT DETECTED
ENTEROBACTERIACEAE SPECIES: NOT DETECTED
Enterococcus species: NOT DETECTED
Escherichia coli: NOT DETECTED
HAEMOPHILUS INFLUENZAE: NOT DETECTED
Klebsiella oxytoca: NOT DETECTED
Klebsiella pneumoniae: NOT DETECTED
Listeria monocytogenes: NOT DETECTED
NEISSERIA MENINGITIDIS: NOT DETECTED
PSEUDOMONAS AERUGINOSA: NOT DETECTED
Proteus species: NOT DETECTED
STAPHYLOCOCCUS SPECIES: NOT DETECTED
STREPTOCOCCUS AGALACTIAE: NOT DETECTED
STREPTOCOCCUS PNEUMONIAE: NOT DETECTED
STREPTOCOCCUS SPECIES: NOT DETECTED
Serratia marcescens: NOT DETECTED
Staphylococcus aureus (BCID): NOT DETECTED
Streptococcus pyogenes: NOT DETECTED

## 2017-05-26 NOTE — Telephone Encounter (Signed)
On 05/26/17 I received a d/c from [place] (original). The d/c is for burial. The patient is a patient of Doctor Mannam. The d/c will be taken to [facility] [address] for signature.  On 05/27/17 I received the d/c back from Doctor Mannam. I got the d/c ready and called the funeral home to let them know the d/c is ready for pickup.

## 2017-05-28 LAB — CULTURE, BLOOD (ROUTINE X 2)

## 2017-05-29 LAB — CULTURE, BLOOD (ROUTINE X 2)
Culture: NO GROWTH
Culture: NO GROWTH
SPECIAL REQUESTS: ADEQUATE
Special Requests: ADEQUATE

## 2017-05-30 LAB — CULTURE, BLOOD (ROUTINE X 2): SPECIAL REQUESTS: ADEQUATE

## 2017-06-23 ENCOUNTER — Ambulatory Visit: Payer: Medicare Other | Admitting: Internal Medicine

## 2017-06-23 NOTE — Progress Notes (Signed)
Troponin was collected again around 02:00.

## 2017-06-23 NOTE — Procedures (Signed)
Extubation Procedure Note  Patient Details:   Name: JENNINE PEDDY DOB: 1932/02/15 MRN: [id number]   Airway Documentation:     Evaluation  O2 sats: stable throughout but decreased to the 50's within 5 minutes of extubation.  Complications: No apparent complications Patient did tolerate procedure well. Bilateral Breath Sounds: Clear, Diminished   No   Patient was terminally extubated to room air per MD order with RN at the bedside.   Claretta Fraise 2017/06/15, 1:37 PM

## 2017-06-23 NOTE — Progress Notes (Signed)
PULMONARY  / CRITICAL CARE MEDICINE  Name: Alexandra Gross MRN: [id number] DOB: 03/23/1931    LOS: 80  REFERRING MD :  EDP  CHIEF COMPLAINT:  Cardiac Arrest   BRIEF PATIENT DESCRIPTION: Patient is a 84 y.o female with CAD, HFpEF, CKD, and DM who presented to the ED via EMS for PEA arrest. Unfortunately exact timing of the event is unclear. Last known contact time was at [facility] on 4/1 and 45 minutes later the patient was found down unresponsive. EMS was called and the patient subsequently underwent another 45 minutes of CPR. On arrival she was hypothermic to 46F and initial labs were significant for lactic acidosis and elevated troponin. She is currently intubated without sedation and on norepinephrine for BP support.   LINES / TUBES: Endotracheal tube 4/1  Left CVC 4/1 Left peripheral IV near the anterior cubital fossa 4/1  Left peripheral IV in the hand 4/1  Right peripheral IV in the hand 4/1 Urinary catheter 4/1 Rectal tube 4/1  CULTURES: Blood cultures obtained on 4/1, pending   ANTIBIOTICS: Zosyn started on 4/ 1  INTERVAL HISTORY:  Patient continues to be unarousable and require complete support. Nursing repots that the grandson got into town yesterday around midnight and will be back up today.   VITAL SIGNS: Temp:  [91.6 F (33.1 C)-100 F (37.8 C)] 99.9 F (37.7 C) (04/02 0600) Pulse Rate:  [64-113] 112 (04/02 0600) Resp:  [0-24] 22 (04/02 0600) BP: (55-161)/(29-108) 98/61 (04/02 0600) SpO2:  [93 %-100 %] 96 % (04/02 0600) FiO2 (%):  [40 %-50 %] 40 % (04/02 0400) Weight:  [198 lb 13.7 oz (90.2 kg)-211 lb 10.3 oz (96 kg)] 211 lb 10.3 oz (96 kg) (04/02 0453)  VENTILATOR SETTINGS: Vent Mode: PRVC FiO2 (%):  [40 %-50 %] 40 % Set Rate:  [24 bmp] 24 bmp Vt Set:  [420 mL] 420 mL PEEP:  [5 cmH20] 5 cmH20 Plateau Pressure:  [20 cmH20-23 cmH20] 22 cmH20   INTAKE / OUTPUT: Intake/Output      04/01 0701 - 04/02 0700 04/02 0701 - 04/03 0700   I.V. (mL/kg) 4204.9 (43.8)    Other 0    IV Piggyback 100    Total Intake(mL/kg) 4304.9 (44.8)    Urine (mL/kg/hr) 0 (0)    Stool 2050    Total Output 2050    Net +2254.9         Stool Occurrence 1 x     PHYSICAL EXAMINATION: General: Obese female  Neuro: Unarousable, absent pupillary response, flaccid paralysis  HEENT: Normocephalic, atrauamtic, moist mucus membranes  Cardiovascular: RRR, no murmurs, no rubs  Lungs: Good air movement with no apparent wheezing  Abdomen: Soft, non-distended Musculoskeletal: No LE edema  Skin: Warm and dry   LABS: Cbc Recent Labs  Lab 05/18/17 0937  06/15/2017 0643 06/14/2017 1822 June 16, 2017 0421  WBC 9.0  --  16.4*  --  12.1*  HGB 11.9*   < > 9.4* 11.9* 11.7*  HCT 35.9*   < > 32.2* 38.5 35.9*  PLT 152.0  --  167  --  139*   < > = values in this interval not displayed.   Chemistry Recent Labs  Lab 05/18/17 [phone] 06/18/2017 0635 05/26/2017 0643 16-Jun-2017 0421  NA 140 142 142 137  K 4.4 4.2 4.3 3.5  CL 108 113* 111 102  CO2 26  --  11* 19*  BUN 23 19 17  22*  CREATININE 1.38* 1.70* 1.96* 3.10*  CALCIUM 9.2  --  8.3*  7.3*  MG  --   --   --  1.2*  PHOS  --   --   --  1.6*  GLUCOSE 252* 415* 429* 128*   Liver fxn Recent Labs  Lab 05/18/17 0937 06/06/2017 0643  AST 22 50*  ALT 19 33  ALKPHOS 72 60  BILITOT 0.5 0.4  PROT 7.7 5.2*  ALBUMIN 3.8 2.4*   coags Recent Labs  Lab 06/13/2017 0635  INR 1.64   Sepsis markers Recent Labs  Lab 05/28/2017 0635 06/09/2017 1103  LATICACIDVEN 16.23* 10.2*  PROCALCITON  --  1.89   Cardiac markers Recent Labs  Lab 06/10/2017 1822 Jun 01, 2017 0205 June 01, 2017 0421  TROPONINI >65.00* >65.00* >65.00*   BNP No results for input(s): PROBNP in the last 168 hours. ABG Recent Labs  Lab 06/02/2017 0754 06/14/2017 1615 June 01, 2017 0415  PHART 6.995* 7.415 7.502*  PCO2ART 39.7 20.3* 22.5*  PO2ART 286.0* 98.0 60.0*  HCO3 10.2* 13.5* 17.6*  TCO2 12* 14* 18*   CBG trend Recent Labs  Lab 06/01/17 0158 06/01/17 0310 2017/06/01 0423  2017/06/01 0523 06-01-17 0633  GLUCAP 125* 111* 132* 128* 135*   DIAGNOSES: Active Problems:   Cardiac arrest (HCC)  ASSESSMENT / PLAN: Patient is a 84 y.o female with CAD, HFpEF, CKD, and DM who presented to the ED via EMS for PEA arrest and prolonged downtime (>45 minutes). Her PE is consistent with anoxic brain injury including absent gag reflex, flaccid paralysis, and absent pupillary response. Overal poor prognosis.   NEUROLOGIC A: ? Anoxic Brain Injury  Prolonged downtime  Absent gag reflex, flaccid paralysis, and absent pupillary response  P:   Family traveling in from [place], [place] discussion and/or evaluation for possible brain death  EEG  PULMONARY A: Acute Hypoxic Respiratory Failure s/p PEA arrest  Bilateral pulmonary edema   P:   ABGs remarkable for respiratory alkalosis, would recommend decreasing the respiratory rate from 24 to 22.  Repeat CXR this AM is improved compared to yesterday but still shows prominent right pulmonary edema  Continue vent support and VAP precautions.   CARDIOVASCULAR A: PEA arrest, CAD Probable anteroseptal STEMI  P:  Evaluated by cardiology and started on ASA, clopidogrel load, and IV heparin  Troponin remains grossly elevated >65  RENAL A: Acute Kidney Injury  Creatinine up to 3.10 today  Patient anuric  Remains acidotic   P:   Likely ATN from prolonged hypoperfusion  Not an HD candidate   GASTROINTESTINAL A:  PUD prophylaxis  H/o of Hemorrhoidal bleeding   PLAN:   Patient is having some blood bowel movements after starting heparin but Hgb is stable around 11.  Continue pantoprazole 40 mg QD.  HEMATOLOGIC A:  Leukocytosis   P:  Likely stress but is on Zosyn. Continue to monitor   INFECTIOUS A: Leukocytosis, ? Infection  P:   Blood cultures pending  Continuing Zosyn   ENDOCRINE A: Diabetes Mellitus Hyperglycemia   PLAN:   On Insulin drip + D5  Pulmonary and [place] Pager:  [phone]  06/01/17, 7:34 AM

## 2017-06-23 NOTE — Progress Notes (Addendum)
Patient was terminally extubated @ 1337 with grandson by her side, Pt time of death 9. [facility] notified, pt may have tissue donated.   Pt was on Morphine drip 1/1  Wasted 95 mls in sink, witness with RN also documented in the pyxis.

## 2017-06-23 NOTE — Progress Notes (Addendum)
ANTICOAGULATION CONSULT NOTE - Follow Up Consult  Pharmacy Consult for heparin Indication: chest pain/ACS  Allergies  Allergen Reactions  . Lisinopril Other (See Comments)    unknown  . Penicillins Other (See Comments)    Patient Measurements: Height: 5\' 5"  (165.1 cm) Weight: 211 lb 10.3 oz (96 kg) IBW/kg (Calculated) : 57 Heparin Dosing Weight: 73 kg  Vital Signs: Temp: 99.5 F (37.5 C) (04/02 1000) BP: 85/68 (04/02 1000) Pulse Rate: 105 (04/02 1000)  Labs: Recent Labs    06/14/2017 8811 06/19/2017 0315  06/18/2017 1822 06/14/2017 2145 06-15-17 0205 2017/06/15 0421 June 15, 2017 0741  HGB 9.9* 9.4*  --  11.9*  --   --  11.7*  --   HCT 29.0* 32.2*  --  38.5  --   --  35.9*  --   PLT  --  167  --   --   --   --  139*  --   LABPROT 19.3*  --   --   --   --   --   --   --   INR 1.64  --   --   --   --   --   --   --   HEPARINUNFRC  --   --   --   --  0.37  --  0.45  --   CREATININE 1.70* 1.96*  --   --   --   --  3.10*  --   TROPONINI  --   --    < > >65.00*  --  >65.00* >65.00* >65.00*   < > = values in this interval not displayed.    Estimated Creatinine Clearance: 15.8 mL/min (A) (by C-G formula based on SCr of 3.1 mg/dL (H)).   Assessment: 56 yoF s/p PEA arrest on heparin for r/o ACS. Heparin level therapeutic at 0.45, CBC stable. Pt previously thought to have had blood in BMs but this has resolved per RN.   Goal of Therapy:  Heparin level 0.3-0.7 units/ml Monitor platelets by anticoagulation protocol: Yes   Plan:  -Continue heparin 850 units/hr - F/U LOT per cards -Check daily heparin level, CBC -Monitor closely for S/Sx bleeding  Arrie Senate, PharmD, BCPS PGY-2 Cardiology Pharmacy Resident Pager: [phone] 06/15/17

## 2017-06-23 NOTE — Progress Notes (Signed)
Progress Note  Patient Name: Alexandra Gross Date of Encounter: 06-06-2017  Primary Cardiologist: No primary care provider on file. [place]  Subjective   Unresponsive, intubated. No signs of neurological recovery.  Mechanically ventilated. Remains in shock, pressor dependent.  Anuric 3 consecutive troponin>65.  Inpatient Medications    Scheduled Meds: . chlorhexidine gluconate (MEDLINE KIT)  15 mL Mouth Rinse BID  . mouth rinse  15 mL Mouth Rinse 10 times per day  . pantoprazole sodium  40 mg Per Tube Daily   Continuous Infusions: . sodium chloride    . ampicillin-sulbactam (UNASYN) IV    . dextrose 5 % and 0.45% NaCl 50 mL/hr at Jun 06, 2017 1000  . epinephrine Stopped (06/10/2017 0753)  . heparin 850 Units/hr (Jun 06, 2017 1000)  . insulin (NOVOLIN-R) infusion 6.7 Units/hr (06/06/2017 1038)  . norepinephrine (LEVOPHED) Adult infusion 8.96 mcg/min (06-06-2017 0900)  . potassium PHOSPHATE IVPB (mmol) 30 mmol (2017/06/06 0755)  .  sodium bicarbonate  infusion 1000 mL 100 mL/hr at 2017-06-06 1000   PRN Meds: sodium chloride   Vital Signs    Vitals:   06-Jun-2017 0800 2017-06-06 0821 06-06-17 0900 06-06-2017 1000  BP: 113/63 115/64 (!) 93/58 (!) 85/68  Pulse: (!) 106 (!) 110 (!) 106 (!) 105  Resp: (!) 0 _0 Temp: 99.7 F (37.6 C)  99.5 F (37.5 C) 99.5 F (37.5 C)  TempSrc:      SpO2: 95% 95% 94% 93%  Weight:      Height:        Intake/Output Summary (Last 24 hours) at Jun 06, 2017 1108 Last data filed at 06-Jun-2017 1000 Gross per 24 hour  Intake 4416 ml  Output 2350 ml  Net 2066 ml   Filed Weights   06/19/2017 0655 06/02/2017 1036 06-06-2017 0453  Weight: 200 lb (90.7 kg) 198 lb 13.7 oz (90.2 kg) 211 lb 10.3 oz (96 kg)    Telemetry    Sinus rhythm with very frequent ectopic beats and long PR interval- Personally Reviewed  ECG    No new tracing - Personally Reviewed  Physical Exam  Unresponsive, intubated, mechanically ventilated GEN:  Does not respond to painful stimuli Neck:  No JVD Cardiac: RRR, no murmurs, rubs, or gallops.  Respiratory: Clear to auscultation bilaterally.  Except for scattered rhonchi GI: Soft, nontender, non-distended  MS: No edema; No deformity. Neuro:   Pupils are fixed.  No gag or cough reflex noted. PsychReubin Milan evaluate  Labs    Chemistry Recent Labs  Lab 06/05/2017 0635 06/21/2017 0643 06/06/2017 0421  NA 142 142 137  K 4.2 4.3 3.5  CL 113* 111 102  CO2  --  11* 19*  GLUCOSE 415* 429* 128*  BUN 19 17 22*  CREATININE 1.70* 1.96* 3.10*  CALCIUM  --  8.3* 7.3*  PROT  --  5.2*  --   ALBUMIN  --  2.4*  --   AST  --  50*  --   ALT  --  33  --   ALKPHOS  --  60  --   BILITOT  --  0.4  --   GFRNONAA  --  22* 13*  GFRAA  --  26* 15*  ANIONGAP  --  20* 16*     Hematology Recent Labs  Lab 06/14/2017 0643 06/05/2017 1822 06-Jun-2017 0421  WBC 16.4*  --  12.1*  RBC 3.26*  --  4.18  HGB 9.4* 11.9* 11.7*  HCT 32.2* 38.5 35.9*  MCV 98.8  --  85.9  MCH 28.8  --  28.0  MCHC 29.2*  --  32.6  RDW 14.1  --  14.1  PLT 167  --  139*    Cardiac Enzymes Recent Labs  Lab 06/11/2017 1822 06/05/17 0205 06/05/17 0421 June 05, 2017 0741  TROPONINI >65.00* >65.00* >65.00* >65.00*    Recent Labs  Lab 05/31/2017 0633  TROPIPOC 0.18*     BNP Recent Labs  Lab 06/12/2017 0634 06/17/2017 1103  BNP 487.3* 379.7*     DDimer No results for input(s): DDIMER in the last 168 hours.   Radiology    Dg Chest Port 1 View  Result Date: 06/05/17 CLINICAL DATA:  Followup ventilator support. EXAM: PORTABLE CHEST 1 VIEW COMPARISON:  06/08/2017. FINDINGS: Endotracheal tube tip is 3 cm above the carina. Nasogastric tube enters the abdomen. Left internal jugular central line tip is in the SVC 2 cm above the right atrium. Mild edema is improved. Some persistent lower lobe edema and atelectasis, probably with small effusions. IMPRESSION: Less pulmonary edema in general. Some persistent edema and volume loss in the lower lobes. Associated small effusions.  Electronically Signed   By: Nelson Chimes M.D.   On: June 05, 2017 07:15   Dg Chest Portable 1 View  Result Date: 06/05/2017 CLINICAL DATA:  Cardiopulmonary arrest. EXAM: PORTABLE CHEST 1 VIEW 9:09 a.m. COMPARISON:  06/10/2017 at 6:39 a.m. and chest x-ray dated 12/30/2014 FINDINGS: Endotracheal tube in good position. New left jugular vein catheter tip is in the superior vena cava in good position. NG tube tip below the diaphragm. Slight bilateral interstitial pulmonary edema, increased slightly on the right and improved in the left perihilar region. Heart size and vascularity are normal. Aortic atherosclerosis. IMPRESSION: 1. Endotracheal tube and central line and NG tube in good position. 2. Bilateral interstitial edema, slightly increased on the right and improved on the left. Electronically Signed   By: Lorriane Shire M.D.   On: 06/09/2017 09:28   Dg Chest Portable 1 View  Result Date: 06/21/2017 CLINICAL DATA:  Status post CPR with subsequent intubation of the trachea and esophagus. EXAM: PORTABLE CHEST 1 VIEW COMPARISON:  PA chest x-ray of December 30, 2014 FINDINGS: The endotracheal tube tip approximately projects approximately 5.1 cm above the carina. The esophagogastric tube tip and proximal port project below the GE junction. The lungs are well-expanded. The interstitial markings are increased diffusely greatest in the left upper lobe. The cardiac silhouette is enlarged. The pulmonary vascularity is indistinct. External pacemaker defibrillator pads are present. The visualized ribs appear intact. IMPRESSION: Reasonable positioning of the endotracheal and esophagogastric tubes. Bilateral pulmonary vascular congestion and interstitial edema. Mild cardiomegaly. Electronically Signed   By: David  Martinique M.D.   On: 06/22/2017 07:16    Patient Profile     84 y.o. female coronary artery disease had cardiopulmonary arrest with prolonged resuscitation, initial rhythm PEA, now unresponsive, cardiogenic shock,  anuric, pressor dependent.  ECG and labs suggest a very extensive anteroseptal myocardial infarction (ECG consistent with left main or proximal LAD obstruction).  Assessment & Plan    Unfortunately, neurological prognosis appears grim. Remains in shock, anuric, unresponsive. Aggressive cardiac evaluation and intervention is not warranted. Discussed the dire situation with her grandson, Donnella Sham, who reports that he is is the closest family member.  Her other closest relative is another grandson, who is currently traveling in [place]. Palliative care appears to be the best approach at this time.  Odds of meaningful recovery are practically zero.  For questions or updates, please  contact [place] Please consult [email or web address] for contact info under Cardiology/STEMI.      Signed, Sanda Klein, MD  Jun 12, 2017, 11:08 AM

## 2017-06-23 NOTE — Death Summary Note (Addendum)
  Name: Alexandra Gross MRN: [id number] DOB: October 23, 1931 84 y.o.  Date of Admission: 06/17/2017  6:26 AM Date of Discharge: 2017/06/24 Attending Physician: Marshell Garfinkel, MD  Discharge Diagnosis: Active Problems:   Cardiac arrest [facility])  Cardiac Arrest secondary to Anterolateral STEMI  Cause of death: Anterolateral STEMI Time of death: 13:49  Disposition and follow-up:   Ms.Alexandra Gross was discharged from [facility] in expired condition.    Hospital Course: Patient is a 84 y.o female with CAD, HFpEF, CKD, and DM who presented to the ED via EMS for PEA arrest. Unfortunately exact timing of the event is unclear. Last known contact time was at [facility] on 4/1 and 45 minutes later the patient was found down unresponsive. EMS was called and the patient subsequently underwent another 45 minutes of CPR. On arrival she was hypothermic to 82F and initial labs were significant for lactic acidosis and elevated troponin. Her neurological exam was remarkable for absent gag reflex, pupillary reflex, and failed SBTs. She required complete ventilation support and BP support with norepinephrine. After goals of care discussion with the patient's grandson, family elected to withdrawal supportive care.   Signed: Ina Homes, MD 2017/06/24, 1:36 PM

## 2017-06-23 DEATH — deceased

## 2017-09-23 ENCOUNTER — Encounter (HOSPITAL_COMMUNITY): Payer: Medicare Other

## 2018-04-06 IMAGING — DX DG CHEST 1V PORT
1 series · 1 of 1 positions shown · non-contrast
Comparison: PA chest x-ray December 30, 2014

CLINICAL DATA: Status post CPR with subsequent intubation of the
trachea and esophagus.

EXAM:
PORTABLE CHEST 1 VIEW

[chest ap]
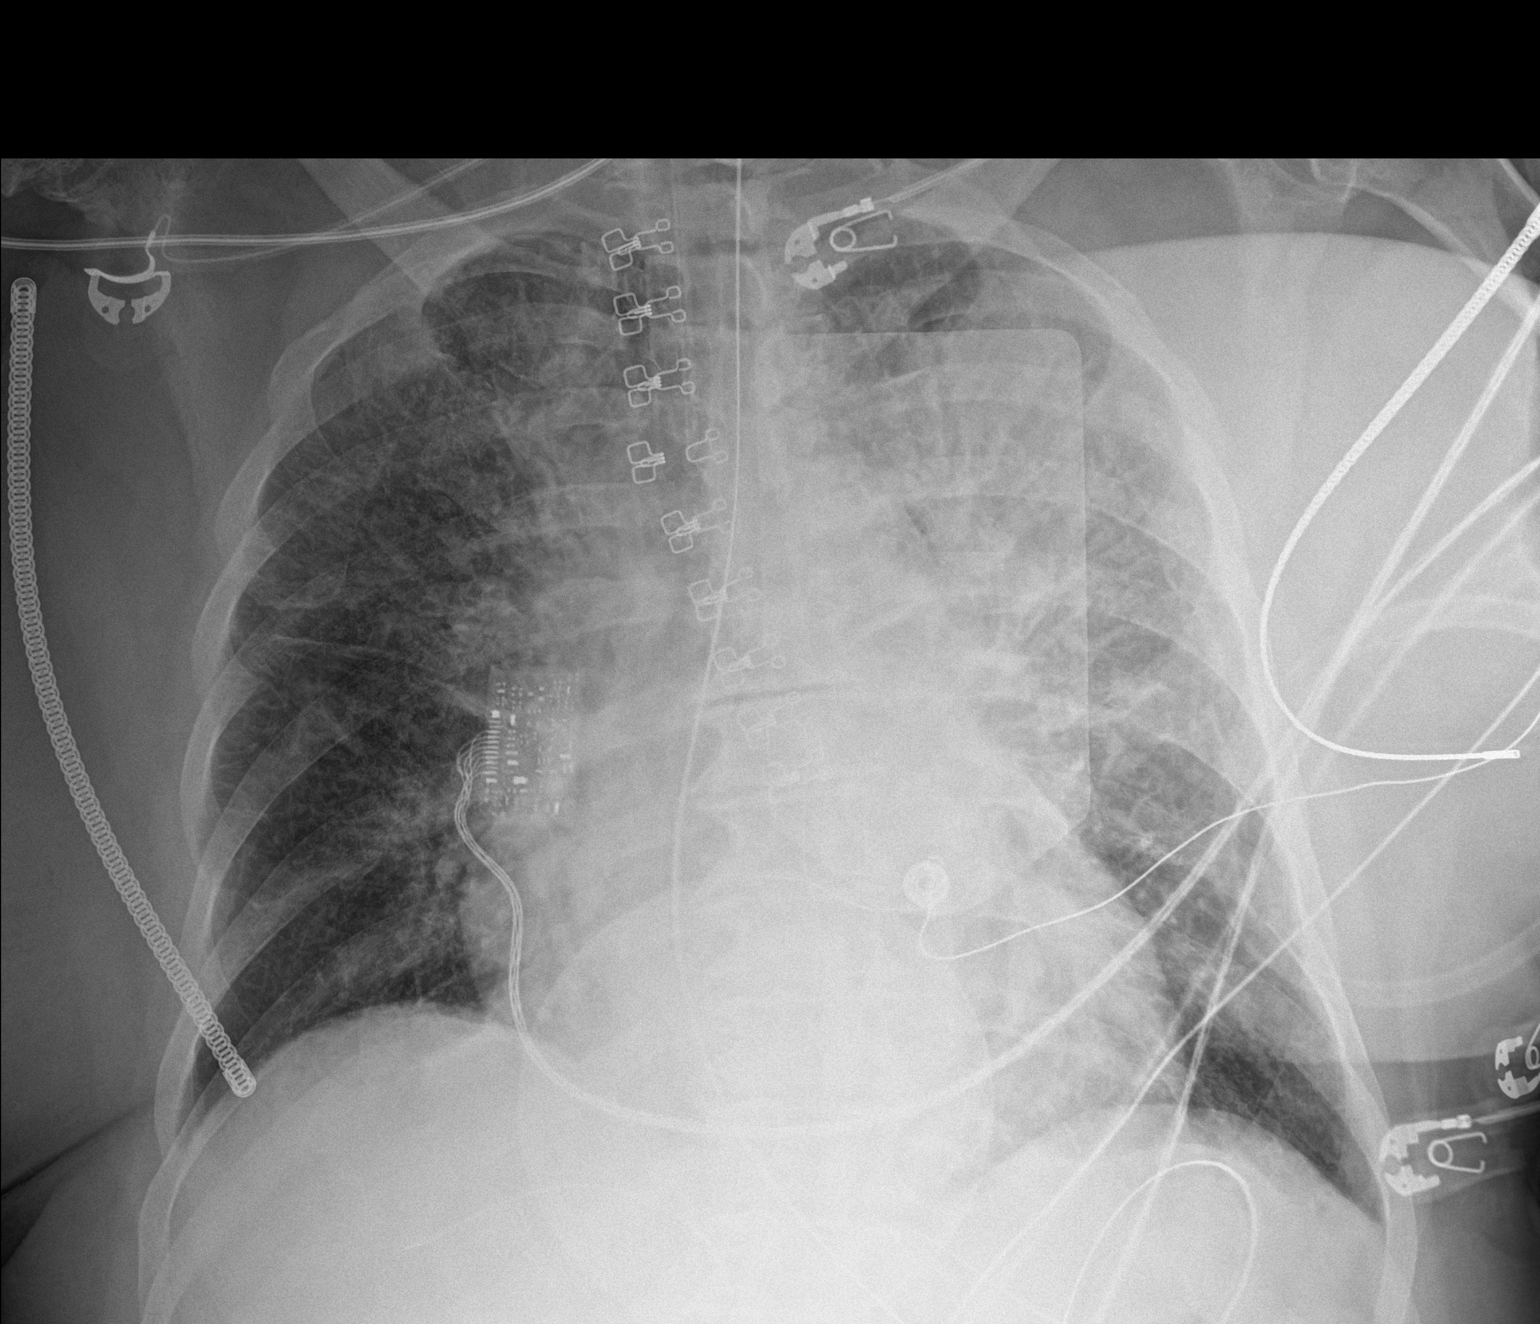

[1 of 1 positions shown; findings below may reference images not displayed]

FINDINGS: The endotracheal tube tip approximately projects approximately
cm above the carina. The esophagogastric tube tip and proximal port
project below the GE junction. The lungs are well-expanded. The
interstitial markings are increased diffusely greatest in the left
upper lobe. The cardiac silhouette is enlarged. The pulmonary
vascularity is indistinct. External pacemaker defibrillator pads are
present. The visualized ribs appear intact.
IMPRESSION: Reasonable positioning of the endotracheal and esophagogastric
tubes.

Bilateral pulmonary vascular congestion and interstitial edema. Mild
cardiomegaly.

## 2018-04-07 IMAGING — DX DG CHEST 1V PORT
1 series · 1 of 1 positions shown · non-contrast
Comparison: 05/24/2017.

CLINICAL DATA: Followup ventilator support.

EXAM:
PORTABLE CHEST 1 VIEW

[chest ap]
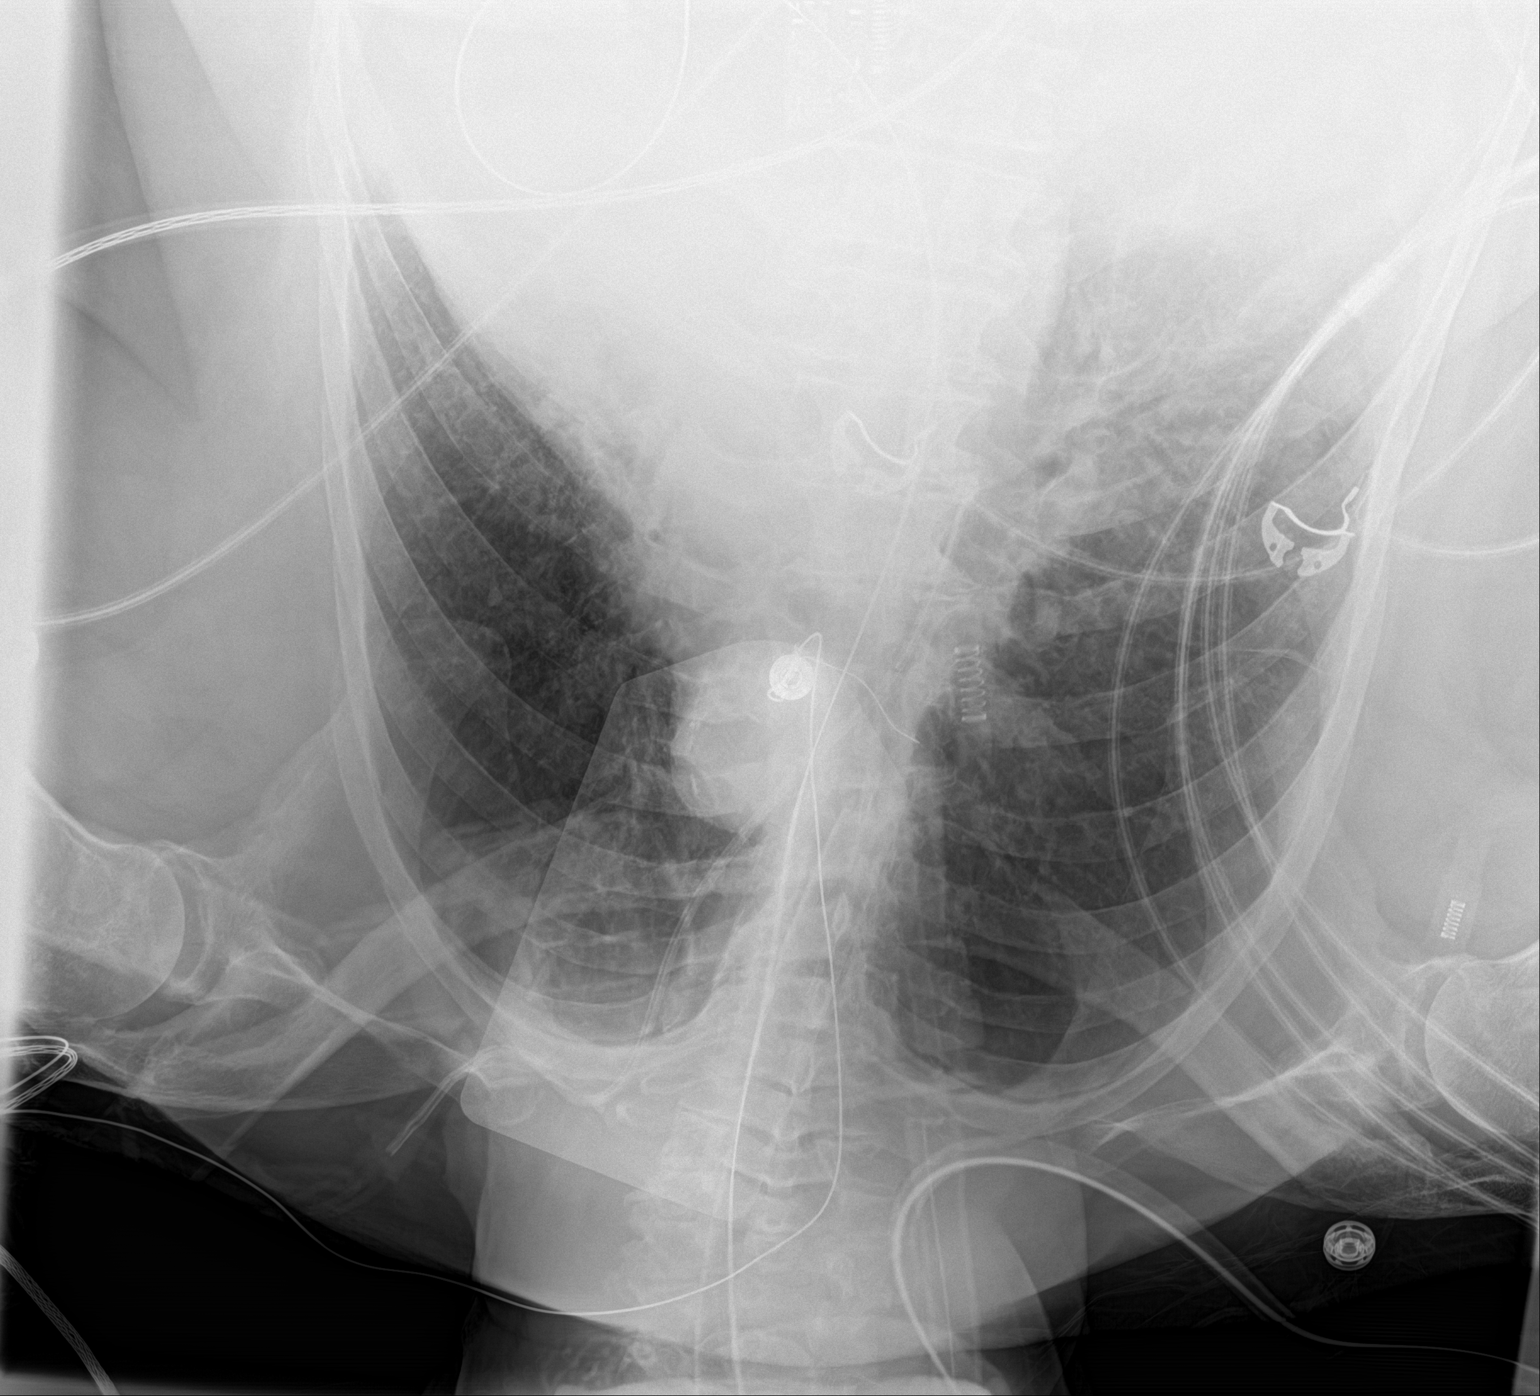

[1 of 1 positions shown; findings below may reference images not displayed]

FINDINGS: Endotracheal tube tip is 3 cm above the carina. Nasogastric tube
enters the abdomen. Left internal jugular central line tip is in the
SVC 2 cm above the right atrium. Mild edema is improved. Some
persistent lower lobe edema and atelectasis, probably with small
effusions.
IMPRESSION: Less pulmonary edema in general. Some persistent edema and volume
loss in the lower lobes. Associated small effusions.
